# Patient Record
Sex: Female | Born: 1955 | Race: White | Hispanic: No | Smoking: Current every day smoker
Health system: Southern US, Community
[De-identification: ages and names within clinical notes are randomized; demographics above are authoritative.]

## PROBLEM LIST (undated history)

## (undated) DIAGNOSIS — J449 Chronic obstructive pulmonary disease, unspecified: Secondary | ICD-10-CM

## (undated) DIAGNOSIS — F5104 Psychophysiologic insomnia: Secondary | ICD-10-CM

## (undated) DIAGNOSIS — K648 Other hemorrhoids: Secondary | ICD-10-CM

## (undated) DIAGNOSIS — K5790 Diverticulosis of intestine, part unspecified, without perforation or abscess without bleeding: Secondary | ICD-10-CM

## (undated) DIAGNOSIS — E119 Type 2 diabetes mellitus without complications: Secondary | ICD-10-CM

## (undated) DIAGNOSIS — R7302 Impaired glucose tolerance (oral): Secondary | ICD-10-CM

## (undated) DIAGNOSIS — I10 Essential (primary) hypertension: Secondary | ICD-10-CM

## (undated) DIAGNOSIS — S42302A Unspecified fracture of shaft of humerus, left arm, initial encounter for closed fracture: Secondary | ICD-10-CM

## (undated) DIAGNOSIS — F419 Anxiety disorder, unspecified: Secondary | ICD-10-CM

## (undated) HISTORY — PX: OTHER SURGICAL HISTORY: SHX169

## (undated) HISTORY — PX: SEPTOPLASTY: SUR1290

## (undated) HISTORY — DX: Type 2 diabetes mellitus without complications: E11.9

## (undated) HISTORY — DX: Unspecified fracture of shaft of humerus, left arm, initial encounter for closed fracture: S42.302A

---

## 1998-08-03 HISTORY — PX: AUGMENTATION MAMMAPLASTY: SUR837

## 2005-10-23 ENCOUNTER — Other Ambulatory Visit: Payer: Self-pay

## 2005-10-26 ENCOUNTER — Ambulatory Visit: Payer: Self-pay | Admitting: Obstetrics and Gynecology

## 2008-11-06 ENCOUNTER — Ambulatory Visit: Payer: Self-pay | Admitting: Internal Medicine

## 2009-08-16 ENCOUNTER — Ambulatory Visit: Payer: Self-pay | Admitting: Gastroenterology

## 2010-06-02 ENCOUNTER — Ambulatory Visit: Payer: Self-pay | Admitting: Internal Medicine

## 2011-06-04 ENCOUNTER — Ambulatory Visit: Payer: Self-pay | Admitting: Internal Medicine

## 2012-06-07 ENCOUNTER — Ambulatory Visit: Payer: Self-pay | Admitting: Internal Medicine

## 2012-12-21 ENCOUNTER — Ambulatory Visit: Payer: Self-pay | Admitting: Emergency Medicine

## 2013-06-08 ENCOUNTER — Ambulatory Visit: Payer: Self-pay | Admitting: Internal Medicine

## 2014-02-05 DIAGNOSIS — E1159 Type 2 diabetes mellitus with other circulatory complications: Secondary | ICD-10-CM | POA: Insufficient documentation

## 2014-02-05 DIAGNOSIS — F5104 Psychophysiologic insomnia: Secondary | ICD-10-CM | POA: Insufficient documentation

## 2014-02-05 DIAGNOSIS — E1169 Type 2 diabetes mellitus with other specified complication: Secondary | ICD-10-CM | POA: Insufficient documentation

## 2014-02-05 DIAGNOSIS — E785 Hyperlipidemia, unspecified: Secondary | ICD-10-CM | POA: Insufficient documentation

## 2014-02-05 DIAGNOSIS — I152 Hypertension secondary to endocrine disorders: Secondary | ICD-10-CM | POA: Insufficient documentation

## 2014-02-05 DIAGNOSIS — M722 Plantar fascial fibromatosis: Secondary | ICD-10-CM | POA: Insufficient documentation

## 2014-06-05 DIAGNOSIS — E119 Type 2 diabetes mellitus without complications: Secondary | ICD-10-CM | POA: Insufficient documentation

## 2014-06-20 ENCOUNTER — Ambulatory Visit: Payer: Self-pay | Admitting: Internal Medicine

## 2014-09-02 ENCOUNTER — Ambulatory Visit: Payer: Self-pay | Admitting: Family Medicine

## 2015-01-28 ENCOUNTER — Encounter: Payer: Self-pay | Admitting: *Deleted

## 2015-01-29 ENCOUNTER — Ambulatory Visit
Admission: RE | Admit: 2015-01-29 | Discharge: 2015-01-29 | Disposition: A | Discharge status: Home Health | Payer: Managed Care, Other (non HMO) | Source: Ambulatory Visit | Attending: Gastroenterology | Admitting: Gastroenterology

## 2015-01-29 ENCOUNTER — Ambulatory Visit: Payer: Managed Care, Other (non HMO) | Admitting: Anesthesiology

## 2015-01-29 ENCOUNTER — Encounter: Payer: Self-pay | Admitting: *Deleted

## 2015-01-29 ENCOUNTER — Encounter
Admission: RE | Disposition: A | Discharge status: Home Health | Payer: Self-pay | Source: Ambulatory Visit | Attending: Gastroenterology

## 2015-01-29 DIAGNOSIS — I10 Essential (primary) hypertension: Secondary | ICD-10-CM | POA: Diagnosis not present

## 2015-01-29 DIAGNOSIS — Z79899 Other long term (current) drug therapy: Secondary | ICD-10-CM | POA: Insufficient documentation

## 2015-01-29 DIAGNOSIS — F419 Anxiety disorder, unspecified: Secondary | ICD-10-CM | POA: Diagnosis not present

## 2015-01-29 DIAGNOSIS — D127 Benign neoplasm of rectosigmoid junction: Secondary | ICD-10-CM | POA: Insufficient documentation

## 2015-01-29 DIAGNOSIS — K5730 Diverticulosis of large intestine without perforation or abscess without bleeding: Secondary | ICD-10-CM | POA: Diagnosis not present

## 2015-01-29 DIAGNOSIS — J449 Chronic obstructive pulmonary disease, unspecified: Secondary | ICD-10-CM | POA: Insufficient documentation

## 2015-01-29 DIAGNOSIS — F17210 Nicotine dependence, cigarettes, uncomplicated: Secondary | ICD-10-CM | POA: Diagnosis not present

## 2015-01-29 DIAGNOSIS — K621 Rectal polyp: Secondary | ICD-10-CM | POA: Insufficient documentation

## 2015-01-29 DIAGNOSIS — Z86010 Personal history of colonic polyps: Secondary | ICD-10-CM | POA: Insufficient documentation

## 2015-01-29 DIAGNOSIS — D126 Benign neoplasm of colon, unspecified: Secondary | ICD-10-CM | POA: Insufficient documentation

## 2015-01-29 HISTORY — PX: COLONOSCOPY WITH PROPOFOL: SHX5780

## 2015-01-29 HISTORY — DX: Anxiety disorder, unspecified: F41.9

## 2015-01-29 HISTORY — DX: Diverticulosis of intestine, part unspecified, without perforation or abscess without bleeding: K57.90

## 2015-01-29 HISTORY — DX: Impaired glucose tolerance (oral): R73.02

## 2015-01-29 HISTORY — DX: Psychophysiologic insomnia: F51.04

## 2015-01-29 HISTORY — DX: Essential (primary) hypertension: I10

## 2015-01-29 HISTORY — DX: Other hemorrhoids: K64.8

## 2015-01-29 HISTORY — DX: Chronic obstructive pulmonary disease, unspecified: J44.9

## 2015-01-29 SURGERY — COLONOSCOPY WITH PROPOFOL
Anesthesia: General

## 2015-01-29 MED ORDER — PROPOFOL 10 MG/ML IV BOLUS
INTRAVENOUS | Status: DC | PRN
Start: 2015-01-29 — End: 2015-01-29
  Administered 2015-01-29: 50 mg via INTRAVENOUS

## 2015-01-29 MED ORDER — PROPOFOL INFUSION 10 MG/ML OPTIME
INTRAVENOUS | Status: DC | PRN
  Administered 2015-01-29: 120 ug/kg/min via INTRAVENOUS

## 2015-01-29 MED ORDER — SODIUM CHLORIDE 0.9 % IV SOLN
INTRAVENOUS | Status: DC
  Administered 2015-01-29: 15:00:00 via INTRAVENOUS

## 2015-01-29 MED ORDER — IPRATROPIUM-ALBUTEROL 0.5-2.5 (3) MG/3ML IN SOLN
RESPIRATORY_TRACT | Status: AC
  Filled 2015-01-29: qty 3

## 2015-01-29 MED ORDER — SODIUM CHLORIDE 0.9 % IV SOLN
INTRAVENOUS | Status: DC | PRN
Start: 2015-01-29 — End: 2015-01-29
  Administered 2015-01-29 (×2): via INTRAVENOUS

## 2015-01-29 MED ORDER — IPRATROPIUM-ALBUTEROL 0.5-2.5 (3) MG/3ML IN SOLN
3.0000 mL | Freq: Four times a day (QID) | RESPIRATORY_TRACT | Status: DC
  Administered 2015-01-29: 3 mL via RESPIRATORY_TRACT

## 2015-01-29 MED ORDER — MIDAZOLAM HCL 2 MG/2ML IJ SOLN
INTRAMUSCULAR | Status: DC | PRN
  Administered 2015-01-29: 2 mg via INTRAVENOUS

## 2015-01-29 MED ORDER — PHENYLEPHRINE HCL 10 MG/ML IJ SOLN
INTRAMUSCULAR | Status: DC | PRN
  Administered 2015-01-29 (×3): 100 ug via INTRAVENOUS

## 2015-01-29 MED ORDER — LIDOCAINE HCL (CARDIAC) 20 MG/ML IV SOLN
INTRAVENOUS | Status: DC | PRN
  Administered 2015-01-29: 50 mg via INTRAVENOUS

## 2015-01-29 MED ORDER — EPHEDRINE SULFATE 50 MG/ML IJ SOLN
INTRAMUSCULAR | Status: DC | PRN
Start: 2015-01-29 — End: 2015-01-29
  Administered 2015-01-29: 5 mg via INTRAVENOUS

## 2015-01-29 MED ORDER — SODIUM CHLORIDE 0.9 % IV SOLN: INTRAVENOUS | Status: DC

## 2015-01-29 NOTE — Op Note (Signed)
[facility] Gastroenterology Patient Name: Barbara Cortez Procedure Date: 01/29/2015 4:08 PM MRN: [id number] Account #: [id number] Date of Birth: 06/13/1956 Admit Type: Outpatient Age: 59 Room: [facility] ENDO ROOM 3 Gender: Female Note Status: Finalized Procedure:         Colonoscopy Indications:       Personal history of colonic polyps, Change in bowel habits Providers:         Lollie Sails, MD Referring MD:      Christena Flake. Raechel Ache, MD (Referring MD) Medicines:         Monitored Anesthesia Care Complications:     No immediate complications. Procedure:         Pre-Anesthesia Assessment:                    - ASA Grade Assessment: II - A patient with mild systemic                     disease.                    After obtaining informed consent, the colonoscope was                     passed under direct vision. Throughout the procedure, the                     patient's blood pressure, pulse, and oxygen saturations                     were monitored continuously. The Colonoscope was                     introduced through the anus and advanced to the the cecum,                     identified by appendiceal orifice and ileocecal valve. The                     colonoscopy was performed with moderate difficulty due to                     poor bowel prep. Successful completion of the procedure                     was aided by lavage. The patient tolerated the procedure                     well. The quality of the bowel preparation was fair. Findings:      A few small-mouthed diverticula were found in the sigmoid colon and in       the distal descending colon.      A 4 mm polyp was found in the recto-sigmoid colon. The polyp was       sessile. The polyp was removed with a cold snare. Resection and       retrieval were complete.      Biopsies for histology were taken with a cold forceps from the right       colon and left colon for evaluation of microscopic colitis.  Nine sessile polyps were found in the recto-sigmoid colon. The polyps       were 1 to 3 mm in size. These polyps were removed with a cold biopsy       forceps. Resection and retrieval were complete.  Five sessile polyps were found in the rectum. The polyps were 2 to 4 mm       in size. These polyps were removed with a cold biopsy forceps and cold       snare. Resection and retrieval were complete. Impression:        - Diverticulosis in the sigmoid colon and in the distal                     descending colon.                    - One 4 mm polyp at the recto-sigmoid colon. Resected and                     retrieved.                    - Nine 1 to 3 mm polyps at the recto-sigmoid colon.                     Resected and retrieved.                    - Five 2 to 4 mm polyps in the rectum. Resected and                     retrieved.                    - Biopsies were taken with a cold forceps from the right                     colon and left colon for evaluation of microscopic colitis.                    - The distal rectum and anal verge are normal on                     retroflexion view. Recommendation:    - Await pathology results.                    - Telephone GI clinic for pathology results in 1 week.                    - Full liquid diet today.                    - Low residue diet for 3 days. Procedure Code(s): --- Professional ---                    5402618952, Colonoscopy, flexible; with removal of tumor(s),                     polyp(s), or other lesion(s) by snare technique                    45380, 36, Colonoscopy, flexible; with biopsy, single or                     multiple Diagnosis Code(s): --- Professional ---                    211.4, Benign neoplasm of rectum and anal canal                    569.0, Anal and rectal  polyp                    V12.72, Personal history of colonic polyps                    787.99, Other symptoms involving digestive system                     562.10, Diverticulosis of colon (without mention of                     hemorrhage) CPT copyright 2014 American Medical Association. All rights reserved. The codes documented in this report are preliminary and upon coder review may  be revised to meet current compliance requirements. Lollie Sails, MD 01/29/2015 5:13:25 PM This report has been signed electronically. Number of Addenda: 0 Note Initiated On: 01/29/2015 4:08 PM Scope Withdrawal Time: 0 hours 21 minutes 17 seconds  Total Procedure Duration: 0 hours 32 minutes 21 seconds       [facility]

## 2015-01-29 NOTE — H&P (Signed)
Outpatient short stay form Pre-procedure 01/29/2015 3:17 PM Lollie Sails MD  Primary Physician: Dr. Genene Churn  Reason for visit:  Colonoscopy  History of present illness:  Patient is a 59 year old female who is presenting today a personal history of polyps as well as change of bowel habits. She is been having loose/urgent stools hour after eating that occurs randomly. There has  been no exacerbating or alleviating factors identified. In discussion further with her this does not sound like irritable bowel though could be a variant. She takes no aspirin or blood thinning products.    Current facility-administered medications:  .  0.9 %  sodium chloride infusion, , Intravenous, Continuous, Lollie Sails, MD, Last Rate: 50 mL/hr at 01/29/15 1508 .  0.9 %  sodium chloride infusion, , Intravenous, Continuous, Lollie Sails, MD  Prescriptions prior to admission  Medication Sig Dispense Refill Last Dose  . amLODipine (NORVASC) 5 MG tablet Take 5 mg by mouth daily.   01/29/2015 at 0700  . ascorbic acid (VITAMIN C) 500 MG tablet Take 500 mg by mouth daily.     . Calcium Carb-Cholecalciferol (CALCIUM CARBONATE-VITAMIN D3) 600-400 MG-UNIT TABS Take by mouth.     . furosemide (LASIX) 20 MG tablet Take 20 mg by mouth.   01/29/2015 at 0700  . irbesartan (AVAPRO) 300 MG tablet Take 300 mg by mouth daily.   01/29/2015 at 0700  . metoprolol (TOPROL-XL) 200 MG 24 hr tablet Take 200 mg by mouth daily.   01/29/2015 at 0700  . simvastatin (ZOCOR) 20 MG tablet Take 20 mg by mouth daily.     . traZODone (DESYREL) 50 MG tablet Take 50 mg by mouth at bedtime.     Marland Kitchen zolpidem (AMBIEN) 10 MG tablet Take 10 mg by mouth at bedtime as needed for sleep.        Allergies  Allergen Reactions  . Ace Inhibitors   . Hydrochlorothiazide      Past Medical History  Diagnosis Date  . Hypertension   . COPD (chronic obstructive pulmonary disease)   . Anxiety   . Impaired glucose tolerance   . Chronic  insomnia   . Diverticulosis   . Internal hemorrhoids     Review of systems:      Physical Exam    Heart and lungs: Regular rate and rhythm without rub or gallop lungs bilaterally clear    HEENT: Norm cephalic atraumatic eyes are anicteric    Other:     Pertinant exam for procedure: Soft nontender nondistended bowel sounds positive normoactive.    Planned proceedures: Colonoscopy and indicated procedures I have discussed the risks benefits and complications of procedures to include not limited to bleeding, infection, perforation and the risk of sedation and the patient wishes to proceed.    Lollie Sails, MD Gastroenterology 01/29/2015  3:17 PM

## 2015-01-29 NOTE — Anesthesia Postprocedure Evaluation (Signed)
  Anesthesia Post-op Note  Patient: Barbara Cortez  Procedure(s) Performed: Procedure(s): COLONOSCOPY WITH PROPOFOL (N/A)  Anesthesia type:General  Patient location: PACU  Post pain: Pain level controlled  Post assessment: Post-op Vital signs reviewed, Patient's Cardiovascular Status Stable, Respiratory Function Stable, Patent Airway and No signs of Nausea or vomiting  Post vital signs: Reviewed and stable  Last Vitals:  Filed Vitals:   01/29/15 1800  BP:   Pulse: 71  Temp:   Resp: 13    Level of consciousness: awake, alert  and patient cooperative  Complications: No apparent anesthesia complications

## 2015-01-29 NOTE — Transfer of Care (Signed)
Immediate Anesthesia Transfer of Care Note  Patient: Barbara Cortez  Procedure(s) Performed: Procedure(s): COLONOSCOPY WITH PROPOFOL (N/A)  Patient Location: Endoscopy Unit  Anesthesia Type:General  Level of Consciousness: awake, alert , oriented and patient cooperative  Airway & Oxygen Therapy: Patient Spontanous Breathing and Patient connected to nasal cannula oxygen  Post-op Assessment: Report given to RN, Post -op Vital signs reviewed and stable and Patient moving all extremities X 4  Post vital signs: Reviewed and stable  Last Vitals:  Filed Vitals:   01/29/15 1716  BP: 99/62  Pulse: 79  Temp: 36 C  Resp: 12    Complications: No apparent anesthesia complications

## 2015-01-29 NOTE — Anesthesia Preprocedure Evaluation (Addendum)
Anesthesia Evaluation  Patient identified by MRN, date of birth, ID band Patient awake    Reviewed: Allergy & Precautions, NPO status , Patient's Chart, lab work & pertinent test results, reviewed documented beta blocker date and time   Airway Mallampati: II  TM Distance: >3 FB     Dental  (+) Chipped   Pulmonary Current Smoker,    + wheezing      Cardiovascular hypertension,     Neuro/Psych    GI/Hepatic   Endo/Other    Renal/GU      Musculoskeletal   Abdominal   Peds  Hematology   Anesthesia Other Findings She is coughing, some wheezing. Duoneb SVN.  Reproductive/Obstetrics                            Anesthesia Physical Anesthesia Plan  ASA: III  Anesthesia Plan: General   Post-op Pain Management:    Induction: Intravenous  Airway Management Planned: Nasal Cannula  Additional Equipment:   Intra-op Plan:   Post-operative Plan:   Informed Consent: I have reviewed the patients History and Physical, chart, labs and discussed the procedure including the risks, benefits and alternatives for the proposed anesthesia with the patient or authorized representative who has indicated his/her understanding and acceptance.     Plan Discussed with: CRNA  Anesthesia Plan Comments:         Anesthesia Quick Evaluation

## 2015-01-30 ENCOUNTER — Encounter: Payer: Self-pay | Admitting: Gastroenterology

## 2015-01-31 LAB — SURGICAL PATHOLOGY

## 2015-06-19 ENCOUNTER — Other Ambulatory Visit: Payer: Self-pay | Admitting: Internal Medicine

## 2015-06-19 DIAGNOSIS — Z79899 Other long term (current) drug therapy: Secondary | ICD-10-CM | POA: Insufficient documentation

## 2015-06-19 DIAGNOSIS — Z1231 Encounter for screening mammogram for malignant neoplasm of breast: Secondary | ICD-10-CM

## 2015-12-25 ENCOUNTER — Ambulatory Visit
Admission: RE | Admit: 2015-12-25 | Discharge: 2015-12-25 | Disposition: A | Discharge status: Home / Self Care | Payer: Managed Care, Other (non HMO) | Source: Ambulatory Visit | Attending: Internal Medicine | Admitting: Internal Medicine

## 2015-12-25 ENCOUNTER — Other Ambulatory Visit: Payer: Self-pay | Admitting: Internal Medicine

## 2015-12-25 DIAGNOSIS — Z1231 Encounter for screening mammogram for malignant neoplasm of breast: Secondary | ICD-10-CM

## 2017-05-25 ENCOUNTER — Other Ambulatory Visit: Payer: Self-pay | Admitting: Internal Medicine

## 2017-05-25 DIAGNOSIS — Z1231 Encounter for screening mammogram for malignant neoplasm of breast: Secondary | ICD-10-CM

## 2017-06-22 ENCOUNTER — Ambulatory Visit
Admission: RE | Admit: 2017-06-22 | Discharge: 2017-06-22 | Disposition: A | Discharge status: Home / Self Care | Payer: Managed Care, Other (non HMO) | Source: Ambulatory Visit | Attending: Internal Medicine | Admitting: Internal Medicine

## 2017-06-22 DIAGNOSIS — Z1231 Encounter for screening mammogram for malignant neoplasm of breast: Secondary | ICD-10-CM | POA: Insufficient documentation

## 2018-03-07 LAB — HM COLONOSCOPY

## 2018-07-14 DIAGNOSIS — M4727 Other spondylosis with radiculopathy, lumbosacral region: Secondary | ICD-10-CM | POA: Insufficient documentation

## 2018-07-14 DIAGNOSIS — M545 Low back pain, unspecified: Secondary | ICD-10-CM | POA: Insufficient documentation

## 2018-07-14 DIAGNOSIS — Z6835 Body mass index (BMI) 35.0-35.9, adult: Secondary | ICD-10-CM | POA: Insufficient documentation

## 2018-10-19 DIAGNOSIS — I890 Lymphedema, not elsewhere classified: Secondary | ICD-10-CM | POA: Insufficient documentation

## 2018-12-26 ENCOUNTER — Ambulatory Visit
Admission: EM | Admit: 2018-12-26 | Discharge: 2018-12-26 | Disposition: A | Discharge status: Home / Self Care | Payer: Managed Care, Other (non HMO) | Attending: Urgent Care | Admitting: Urgent Care

## 2018-12-26 ENCOUNTER — Other Ambulatory Visit: Payer: Self-pay

## 2018-12-26 ENCOUNTER — Encounter: Payer: Self-pay | Admitting: Emergency Medicine

## 2018-12-26 DIAGNOSIS — S61213A Laceration without foreign body of left middle finger without damage to nail, initial encounter: Secondary | ICD-10-CM

## 2018-12-26 DIAGNOSIS — W269XXA Contact with unspecified sharp object(s), initial encounter: Secondary | ICD-10-CM

## 2018-12-26 MED ORDER — BACITRACIN ZINC 500 UNIT/GM EX OINT: TOPICAL_OINTMENT | Freq: Once | CUTANEOUS | Status: DC

## 2018-12-26 MED ORDER — SILVER NITRATE-POT NITRATE 75-25 % EX MISC
1.0000 | Freq: Once | CUTANEOUS | Status: AC
  Administered 2018-12-26: 17:00:00 1 via TOPICAL

## 2018-12-26 NOTE — Discharge Instructions (Addendum)
It was very nice meeting you today in clinic. Thank you for entrusting me with your care.   As discussed, keep finger clean and dry as possible. Apply antibiotic ointment at least twice a day. Keep covered. Monitor for signs of infection (increased redness, drainage, warmth, fever, swelling). May use Tylenol and/or Ibuprofen as needed for pain/fever.   Make arrangements to follow up with your regular doctor as needed. If your symptoms/condition worsens, please seek follow up care either here or in the ER. Please remember, our [facility] providers are "right here with you" when you need [place].   Again, it was my pleasure to take care of you today. Thank you for choosing our clinic. I hope that you start to feel better quickly.   Honor Loh, MSN, APRN, FNP-C, CEN Advanced Practice Provider [facility]

## 2018-12-26 NOTE — ED Triage Notes (Signed)
Pt cut her right middle finger on a mandolin about 45 minutes ago.  Last tetanus was 07/06/2017

## 2018-12-26 NOTE — ED Provider Notes (Signed)
[address], [place], [place] [address] [phone]   Name: Barbara Cortez DOB: [birth year] MRN: [id number] CSN: [id number] PCP: Ezequiel Kayser, MD  Arrival date and time:  12/26/18 1555  Chief Complaint:  Laceration  NOTE: Prior to seeing the patient today, I have reviewed the triage nursing documentation and vital signs. Clinical staff has updated patient's PMH/PSHx, current medication list, and drug allergies/intolerances to ensure comprehensive history available to assist in medical decision making.   History:   HPI: Barbara Cortez is a 63 y.o. female who presents today with complaints of finger laceration.  Approximately 1 hour prior to arrival, patient notes that she was cutting potatoes using a mandolin.  Blade slipped and cut the tip of the 3rd digit on her LEFT hand.  Patient presents to the clinic bleeding despite direct pressure.  Patient is not on daily aspirin or DOAC therapy. There is no nail involvement.  She notes that her tetanus vaccination is up-to-date per recommended guidelines.  Past Medical History:  Diagnosis Date   Anxiety    Chronic insomnia    COPD (chronic obstructive pulmonary disease) ([place])    Diverticulosis    Hypertension    Impaired glucose tolerance    Internal hemorrhoids     Past Surgical History:  Procedure Laterality Date   AUGMENTATION MAMMAPLASTY Bilateral 2000   COLONOSCOPY WITH PROPOFOL N/A 01/29/2015   Procedure: COLONOSCOPY WITH PROPOFOL;  Surgeon: Lollie Sails, MD;  Location: [facility] ENDOSCOPY;  Service: Endoscopy;  Laterality: N/A;   Hysteroscopy wtih enodmetrial ablation     Saline Breast Implants     SEPTOPLASTY      Family History  Problem Relation Age of Onset   Breast cancer Maternal Aunt    Breast cancer Other 32    Social History   Socioeconomic History   Marital status: Divorced    Spouse name: Not on file   Number of children: Not on file   Years of education: Not on file     Highest education level: Not on file  Occupational History   Not on file  Social Needs   Financial resource strain: Not on file   Food insecurity:    Worry: Not on file    Inability: Not on file   Transportation needs:    Medical: Not on file    Non-medical: Not on file  Tobacco Use   Smoking status: Current Every Day Smoker    Packs/day: 0.50    Types: Cigarettes   Smokeless tobacco: Never Used  Substance and Sexual Activity   Alcohol use: Yes    Comment: occasionally   Drug use: No   Sexual activity: Not on file  Lifestyle   Physical activity:    Days per week: Not on file    Minutes per session: Not on file   Stress: Not on file  Relationships   Social connections:    Talks on phone: Not on file    Gets together: Not on file    Attends religious service: Not on file    Active member of club or organization: Not on file    Attends meetings of clubs or organizations: Not on file    Relationship status: Not on file   Intimate partner violence:    Fear of current or ex partner: Not on file    Emotionally abused: Not on file    Physically abused: Not on file    Forced sexual activity: Not on file  Other Topics Concern  Not on file  Social History Narrative   Not on file    There are no active problems to display for this patient.   Home Medications:    Current Meds  Medication Sig   amLODipine (NORVASC) 5 MG tablet Take 5 mg by mouth daily.   ascorbic acid (VITAMIN C) 500 MG tablet Take 500 mg by mouth daily.   Calcium Carb-Cholecalciferol (CALCIUM CARBONATE-VITAMIN D3) 600-400 MG-UNIT TABS Take by mouth.   furosemide (LASIX) 20 MG tablet Take 20 mg by mouth.   irbesartan (AVAPRO) 300 MG tablet Take 300 mg by mouth daily.   metoprolol (TOPROL-XL) 200 MG 24 hr tablet Take 200 mg by mouth daily.   simvastatin (ZOCOR) 20 MG tablet Take 20 mg by mouth daily.   traZODone (DESYREL) 50 MG tablet Take 50 mg by mouth at bedtime.   zolpidem  (AMBIEN) 10 MG tablet Take 10 mg by mouth at bedtime as needed for sleep.    Allergies:   Ace inhibitors and Hydrochlorothiazide  Review of Systems (ROS): Review of Systems  Constitutional: Negative for chills and fever.  Respiratory: Negative for cough and shortness of breath.   Cardiovascular: Negative for chest pain and palpitations.  Skin: Positive for wound (tip of 3rd digit on LEFT hand).  Neurological: Negative for dizziness and light-headedness.  Psychiatric/Behavioral: The patient is nervous/anxious.      Physical Exam:  Triage Vital Signs ED Triage Vitals  Enc Vitals Group     BP 12/26/18 1614 129/78     Pulse Rate 12/26/18 1614 89     Resp 12/26/18 1614 18     Temp 12/26/18 1614 98.1 F (36.7 C)     Temp Source 12/26/18 1614 Oral     SpO2 12/26/18 1614 99 %     Weight 12/26/18 1611 210 lb (95.3 kg)     Height 12/26/18 1611 5\' 6"  (1.676 m)     Head Circumference --      Peak Flow --      Pain Score 12/26/18 1611 2     Pain Loc --      Pain Edu? --      Excl. in [place]? --     Physical Exam  Constitutional: She is oriented to person, place, and time and well-developed, well-nourished, and in no distress.  HENT:  Head: Normocephalic and atraumatic.  Mouth/Throat: Mucous membranes are normal.  Eyes: EOM are normal.  Cardiovascular: Normal rate, regular rhythm, normal heart sounds and intact distal pulses. Exam reveals no gallop and no friction rub.  No murmur heard. Pulmonary/Chest: Effort normal and breath sounds normal. No respiratory distress. She has no wheezes. She has no rales.  Neurological: She is alert and oriented to person, place, and time.  Skin: Skin is warm and dry. Laceration (0.5 cm skin avulsion to tip of 3rd digit on LEFT hand) noted. No erythema.  Psychiatric: Mood, affect and judgment normal.  Nursing note and vitals reviewed.    Urgent Care Treatments / Results:   LABS: PLEASE NOTE: all labs that were ordered this encounter are listed,  however only abnormal results are displayed. Labs Reviewed - No data to display  EKG: -None  RADIOLOGY: No results found.  PRODEDURES: Laceration Repair Performed by: Karen Kitchens, NP Authorized by: Karen Kitchens, NP   Consent:    Consent obtained:  Verbal   Consent given by:  Patient   Risks discussed:  Infection, need for additional repair, pain, poor cosmetic result and poor  wound healing   Alternatives discussed:  No treatment Universal protocol:    Patient identity confirmed:  Verbally with patient Laceration details:    Location:  Finger   Finger location:  L long finger   Length (cm):  0.5 Repair type:    Repair type:  Simple Exploration:    Hemostasis obtained with: Attempted Surgicel; not effective. Silver nitrate applied; hemostatis achieved.   Wound exploration: wound explored through full range of motion and entire depth of wound probed and visualized     Wound extent: fascia violated   Treatment:    Area cleansed with:  Betadine and saline   Amount of cleaning:  Standard   Irrigation solution:  Sterile saline Skin repair:    Repair method: Avulsion; tip no amenable to closure. Post-procedure details:    Dressing:  Antibiotic ointment, non-adherent dressing and bulky dressing   Patient tolerance of procedure:  Tolerated well, no immediate complications    MEDICATIONS RECEIVED THIS VISIT: Medications  bacitracin ointment (has no administration in time range)  silver nitrate applicators applicator 1 Stick (1 Stick Topical Given 12/26/18 1645)    PERTINENT CLINICAL COURSE NOTES/UPDATES: No data to display   Initial Impression / Assessment and Plan / Urgent Care Course:    Barbara Cortez is a 63 y.o. female who presents to [facility] today with complaints of Laceration  Pertinent labs & imaging results that were available during my care of the patient were personally reviewed by me and considered in my medical decision making (see  lab/imaging section of note for values and interpretations).  Exam revealed 0.5 cm avulsion to the tip of her LEFT middle finger.  Wound not amenable to skin closure.  Again, tetanus status up-to-date.  Attempted Surgicel application, however it was ineffective.  Silver nitrate applied and hemostasis achieved as above.  Patient tolerated procedure well.  Educated on signs and symptoms of infection.  Wound cleansed and TAO applied.  Patient encouraged to keep antibiotic ointment on wound and to keep area covered.  Discussed follow up with primary care physician in 1 week for re-evaluation if needed. I have reviewed the follow up and strict return precautions for any new or worsening symptoms. Patient is aware of symptoms that would be deemed urgent/emergent, and would thus require further evaluation either here or in the emergency department. At the time of discharge, she verbalized understanding and consent with the discharge plan as it was reviewed with her. All questions were fielded by provider and/or clinic staff prior to patient discharge.    Final Clinical Impressions(s) / Urgent Care Diagnoses:   Final diagnoses:  Laceration of left middle finger without foreign body without damage to nail, initial encounter    New Prescriptions:   Meds ordered this encounter  Medications   silver nitrate applicators applicator 1 Stick   bacitracin ointment    Controlled Substance Prescriptions:  [place] Controlled Substance Registry consulted? Not Applicable  NOTE: This note was prepared using Dragon dictation software along with smaller phrase technology. Despite my best ability to proofread, there is the potential that transcriptional errors may still occur from this process, and are completely unintentional.     Karen Kitchens, NP 12/26/18 1800

## 2019-07-20 ENCOUNTER — Other Ambulatory Visit: Payer: Self-pay | Admitting: Internal Medicine

## 2019-07-20 DIAGNOSIS — J310 Chronic rhinitis: Secondary | ICD-10-CM | POA: Insufficient documentation

## 2019-07-20 DIAGNOSIS — Z1231 Encounter for screening mammogram for malignant neoplasm of breast: Secondary | ICD-10-CM

## 2019-09-25 ENCOUNTER — Ambulatory Visit
Admission: RE | Admit: 2019-09-25 | Discharge: 2019-09-25 | Disposition: A | Discharge status: Home / Self Care | Payer: Managed Care, Other (non HMO) | Source: Ambulatory Visit | Attending: Internal Medicine | Admitting: Internal Medicine

## 2019-09-25 ENCOUNTER — Other Ambulatory Visit: Payer: Self-pay

## 2019-09-25 DIAGNOSIS — Z1231 Encounter for screening mammogram for malignant neoplasm of breast: Secondary | ICD-10-CM | POA: Insufficient documentation

## 2020-10-01 LAB — HM PAP SMEAR

## 2020-10-16 ENCOUNTER — Other Ambulatory Visit: Payer: Self-pay | Admitting: Internal Medicine

## 2020-10-16 DIAGNOSIS — Z1231 Encounter for screening mammogram for malignant neoplasm of breast: Secondary | ICD-10-CM

## 2020-10-30 ENCOUNTER — Ambulatory Visit
Admission: RE | Admit: 2020-10-30 | Discharge: 2020-10-30 | Disposition: A | Discharge status: Home / Self Care | Payer: Managed Care, Other (non HMO) | Source: Ambulatory Visit | Attending: Internal Medicine | Admitting: Internal Medicine

## 2020-10-30 ENCOUNTER — Other Ambulatory Visit: Payer: Self-pay

## 2020-10-30 DIAGNOSIS — Z1231 Encounter for screening mammogram for malignant neoplasm of breast: Secondary | ICD-10-CM | POA: Insufficient documentation

## 2021-04-03 LAB — HM DIABETES EYE EXAM

## 2021-06-24 ENCOUNTER — Ambulatory Visit: Payer: Managed Care, Other (non HMO) | Admitting: Family Medicine

## 2021-06-24 ENCOUNTER — Ambulatory Visit (INDEPENDENT_AMBULATORY_CARE_PROVIDER_SITE_OTHER): Payer: Managed Care, Other (non HMO) | Admitting: Family Medicine

## 2021-06-24 ENCOUNTER — Other Ambulatory Visit: Payer: Self-pay

## 2021-06-24 ENCOUNTER — Encounter: Payer: Self-pay | Admitting: Family Medicine

## 2021-06-24 VITALS — BP 146/96 | HR 80 | Ht 66.0 in | Wt 218.0 lb

## 2021-06-24 DIAGNOSIS — J029 Acute pharyngitis, unspecified: Secondary | ICD-10-CM

## 2021-06-24 DIAGNOSIS — R5383 Other fatigue: Secondary | ICD-10-CM | POA: Diagnosis not present

## 2021-06-24 DIAGNOSIS — F5104 Psychophysiologic insomnia: Secondary | ICD-10-CM

## 2021-06-24 DIAGNOSIS — D126 Benign neoplasm of colon, unspecified: Secondary | ICD-10-CM

## 2021-06-24 DIAGNOSIS — E1159 Type 2 diabetes mellitus with other circulatory complications: Secondary | ICD-10-CM

## 2021-06-24 DIAGNOSIS — E1169 Type 2 diabetes mellitus with other specified complication: Secondary | ICD-10-CM

## 2021-06-24 DIAGNOSIS — I152 Hypertension secondary to endocrine disorders: Secondary | ICD-10-CM

## 2021-06-24 DIAGNOSIS — Z7689 Persons encountering health services in other specified circumstances: Secondary | ICD-10-CM | POA: Diagnosis not present

## 2021-06-24 DIAGNOSIS — E119 Type 2 diabetes mellitus without complications: Secondary | ICD-10-CM | POA: Diagnosis not present

## 2021-06-24 DIAGNOSIS — E785 Hyperlipidemia, unspecified: Secondary | ICD-10-CM

## 2021-06-24 MED ORDER — ZOLPIDEM TARTRATE 10 MG PO TABS: 10.0000 mg | ORAL_TABLET | Freq: Every evening | ORAL | 0 refills | Status: DC | PRN

## 2021-06-24 MED ORDER — METFORMIN HCL ER 500 MG PO TB24: 500.0000 mg | ORAL_TABLET | Freq: Every day | ORAL | 1 refills | Status: DC

## 2021-06-24 MED ORDER — METOPROLOL SUCCINATE ER 200 MG PO TB24: 200.0000 mg | ORAL_TABLET | Freq: Every day | ORAL | 1 refills | Status: DC

## 2021-06-24 MED ORDER — FUROSEMIDE 40 MG PO TABS: 40.0000 mg | ORAL_TABLET | Freq: Every day | ORAL | 1 refills | Status: DC

## 2021-06-24 MED ORDER — AMLODIPINE BESYLATE 5 MG PO TABS: 5.0000 mg | ORAL_TABLET | Freq: Every day | ORAL | 1 refills | Status: DC

## 2021-06-24 MED ORDER — IRBESARTAN 300 MG PO TABS: 300.0000 mg | ORAL_TABLET | Freq: Every day | ORAL | 1 refills | Status: DC

## 2021-06-24 MED ORDER — SIMVASTATIN 20 MG PO TABS: 20.0000 mg | ORAL_TABLET | Freq: Every day | ORAL | 1 refills | Status: DC

## 2021-06-24 NOTE — Progress Notes (Signed)
Date:  06/24/2021   Name:  Barbara Cortez   DOB:  04-22-1956   MRN:  [id number]   Chief Complaint: New Patient (Initial Visit) and Establish Care  Patient is a 65 year old female who presents for a comprehensive physical exam. The patient reports the following problems: need med refills. Health maintenance has been reviewed up to date.    Sore Throat  This is a new problem. The current episode started in the past 7 days. The problem has been gradually improving. Neither side of throat is experiencing more pain than the other. There has been no fever. The pain is mild. Pertinent negatives include no abdominal pain, coughing, diarrhea, drooling, ear discharge, ear pain, headaches, hoarse voice, neck pain, shortness of breath or swollen glands. She has had no exposure to strep or mono.   No results found for: CREATININE, BUN, NA, K, CL, CO2 No results found for: CHOL, HDL, LDLCALC, LDLDIRECT, TRIG, CHOLHDL No results found for: TSH No results found for: HGBA1C No results found for: WBC, HGB, HCT, MCV, PLT No results found for: ALT, AST, GGT, ALKPHOS, BILITOT No components found for: VITD  Review of Systems  Constitutional:  Negative for chills and fever.  HENT:  Negative for drooling, ear discharge, ear pain, hoarse voice and sore throat.   Eyes:  Negative for visual disturbance.  Respiratory:  Negative for cough, shortness of breath and wheezing.   Cardiovascular:  Negative for chest pain, palpitations and leg swelling.  Gastrointestinal:  Negative for abdominal pain, blood in stool, constipation, diarrhea and nausea.  Endocrine: Negative for polydipsia.  Genitourinary:  Negative for dysuria, frequency, hematuria and urgency.       Urge incontinenece  Musculoskeletal:  Negative for back pain, myalgias and neck pain.  Skin:  Negative for rash.  Allergic/Immunologic: Negative for environmental allergies.  Neurological:  Negative for dizziness and headaches.  Hematological:   Does not bruise/bleed easily.  Psychiatric/Behavioral:  Negative for suicidal ideas. The patient is not nervous/anxious.    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   Lymphedema of both lower extremities 10/19/2018   Body mass index (BMI) of 35.0 to 35.9 with comorbidity 07/14/2018   Chronic bilateral low back pain without sciatica 07/14/2018   High risk medication use 06/19/2015   Tubular adenoma of colon 01/29/2015   Diabetes mellitus type II, controlled ([place]) 06/05/2014   Chronic insomnia 02/05/2014   Hyperlipidemia associated with type 2 diabetes mellitus ([place]) 02/05/2014   Hypertension associated with type 2 diabetes mellitus ([place]) 02/05/2014   Plantar fasciitis 02/05/2014    Allergies  Allergen Reactions   Ace Inhibitors Cough   Hydrochlorothiazide Itching    Photosensitivity    Past Surgical History:  Procedure Laterality Date   AUGMENTATION MAMMAPLASTY Bilateral 2000   COLONOSCOPY WITH PROPOFOL N/A 01/29/2015   Procedure: COLONOSCOPY WITH PROPOFOL;  Surgeon: Lollie Sails, MD;  Location: [facility] ENDOSCOPY;  Service: Endoscopy;  Laterality: N/A;   Hysteroscopy wtih enodmetrial ablation     Saline Breast Implants     SEPTOPLASTY      Social History   Tobacco Use   Smoking status: Every Day    Packs/day: 0.25    Years: 30.00    Pack years: 7.50    Types: Cigarettes   Smokeless tobacco: Never  Vaping Use   Vaping Use: Never used  Substance Use Topics   Alcohol use: Not Currently    Alcohol/week: 3.0 standard  drinks    Types: 3 Glasses of wine per week   Drug use: Never     Medication list has been reviewed and updated.  Current Meds  Medication Sig   amLODipine (NORVASC) 5 MG tablet Take 5 mg by mouth daily.   ascorbic acid (VITAMIN C) 500 MG tablet Take 500 mg by mouth daily.   Calcium Carb-Cholecalciferol (CALCIUM CARBONATE-VITAMIN D3) 600-400 MG-UNIT TABS Take 1 tablet by mouth daily.   furosemide (LASIX) 40 MG  tablet Take 40 mg by mouth daily.   irbesartan (AVAPRO) 300 MG tablet Take 300 mg by mouth daily.   metFORMIN (GLUCOPHAGE-XR) 500 MG 24 hr tablet Take 500 mg by mouth daily.   metoprolol (TOPROL-XL) 200 MG 24 hr tablet Take 200 mg by mouth daily.   Omega-3 Fatty Acids (FISH OIL) 1000 MG CAPS Take 2 capsules by mouth daily.   Semaglutide,0.25 or 0.5MG /DOS, (OZEMPIC, 0.25 OR 0.5 MG/DOSE,) 2 MG/1.5ML SOPN Inject 0.5 mg into the skin once a week.   simvastatin (ZOCOR) 20 MG tablet Take 20 mg by mouth daily.   zolpidem (AMBIEN) 10 MG tablet Take 10 mg by mouth at bedtime as needed for sleep.    No flowsheet data found.  No flowsheet data found.  BP Readings from Last 3 Encounters:  06/24/21 (!) 152/96  12/26/18 129/78  01/29/15 (!) 142/82    Physical Exam Vitals and nursing note reviewed.  Constitutional:      Appearance: She is well-developed.  HENT:     Head: Normocephalic.     Right Ear: External ear normal.     Left Ear: External ear normal.  Eyes:     General: Lids are everted, no foreign bodies appreciated. No scleral icterus.       Left eye: No foreign body or hordeolum.     Conjunctiva/sclera: Conjunctivae normal.     Right eye: Right conjunctiva is not injected.     Left eye: Left conjunctiva is not injected.     Pupils: Pupils are equal, round, and reactive to light.  Neck:     Thyroid: No thyromegaly.     Vascular: No JVD.     Trachea: No tracheal deviation.  Cardiovascular:     Rate and Rhythm: Normal rate and regular rhythm.     Heart sounds: Normal heart sounds. No murmur heard.   No friction rub. No gallop.  Pulmonary:     Effort: Pulmonary effort is normal. No respiratory distress.     Breath sounds: Normal breath sounds. No wheezing or rales.  Abdominal:     General: Bowel sounds are normal.     Palpations: Abdomen is soft. There is no mass.     Tenderness: There is no abdominal tenderness. There is no guarding or rebound.  Musculoskeletal:         General: No tenderness. Normal range of motion.     Cervical back: Normal range of motion and neck supple.  Lymphadenopathy:     Cervical: No cervical adenopathy.  Skin:    General: Skin is warm.     Findings: No rash.  Neurological:     Mental Status: She is alert and oriented to person, place, and time.     Cranial Nerves: No cranial nerve deficit.     Deep Tendon Reflexes: Reflexes normal.  Psychiatric:        Mood and Affect: Mood is not anxious or depressed.    Wt Readings from Last 3 Encounters:  06/24/21 218 lb (98.9  kg)  12/26/18 210 lb (95.3 kg)  01/29/15 190 lb (86.2 kg)    BP (!) 152/96 (BP Location: Left Arm, Patient Position: Sitting, Cuff Size: Normal)   Pulse 80   Ht 5\' 6"  (1.676 m)   Wt 218 lb (98.9 kg)   SpO2 97%   BMI 35.19 kg/m   Assessment and Plan:`  1. Establishing care with new doctor, encounter for Patient establishing care with new physician.  2. Fatigue, unspecified type New onset with weight gain.  Patient is having increasing fatigue over the past couple months and we will check TSH for current possibility of hypothyroid. - TSH  3. Controlled type 2 diabetes mellitus without complication, without long-term current use of insulin (HCC) Chronic.  Controlled.  Stable.  Currently up to date with a recent refill of Ozempic as well as metformin. - Hemoglobin A1c  4. Chronic insomnia Chronic.  Episodic.  Patient has occasional nights of insomnia which she needs access to Ambien over 68-month.  We will give her 16 tablets. - zolpidem (AMBIEN) 10 MG tablet; Take 1 tablet (10 mg total) by mouth at bedtime as needed for sleep.  Dispense: 16 tablet; Refill: 0  5. Tubular adenoma of colon In 2016 patient had a tubular adenoma of 1 of several polyps that were normal.  Since then she had an episode of rectal bleeding in 2019 which she had a repeat colonoscopy that was unremarkable and apparently her next colonoscopy will be in 2024.  6. Hyperlipidemia  associated with type 2 diabetes mellitus (HCC) Chronic.  Controlled.  Stable.  Currently is on atorvastatin and we will check lipid panel at this time - Lipid Panel With LDL/HDL Ratio  7. Hypertension associated with type 2 diabetes mellitus (HCC) Chronic.  Uncontrolled.  With recent weight gain patient in the 146/96 range.  Patient is going to try to lose weight over the next 3 months and will recheck in February at which time we will adjust medication if necessary.  We will check renal function panel at this time for electrolytes and GFR. - Renal Function Panel  8. Pharyngitis, unspecified etiology New onset.  Persistent.  Gradually weaning.  Throat is red with upcoming holiday centered around children rapid strep was done and this was negative for strep and most likely that this is up a viral nature.

## 2021-06-25 LAB — LIPID PANEL WITH LDL/HDL RATIO
Cholesterol, Total: 208 mg/dL — ABNORMAL HIGH (ref 100–199)
HDL: 60 mg/dL (ref >39)
LDL Chol Calc (NIH): 125 mg/dL — ABNORMAL HIGH (ref 0–99)
LDL/HDL Ratio: 2.1 ratio (ref 0.0–3.2)
Triglycerides: 132 mg/dL (ref 0–149)
VLDL Cholesterol Cal: 23 mg/dL (ref 5–40)

## 2021-06-25 LAB — RENAL FUNCTION PANEL
Albumin: 4.8 g/dL (ref 3.8–4.8)
BUN/Creatinine Ratio: 14 (ref 12–28)
BUN: 11 mg/dL (ref 8–27)
CO2: 28 mmol/L (ref 20–29)
Calcium: 10.2 mg/dL (ref 8.7–10.3)
Chloride: 101 mmol/L (ref 96–106)
Creatinine, Ser: 0.76 mg/dL (ref 0.57–1.00)
Glucose: 97 mg/dL (ref 70–99)
Phosphorus: 3.8 mg/dL (ref 3.0–4.3)
Potassium: 4.3 mmol/L (ref 3.5–5.2)
Sodium: 143 mmol/L (ref 134–144)
eGFR: 87 mL/min/{1.73_m2} (ref >59)

## 2021-06-25 LAB — HEMOGLOBIN A1C
Est. average glucose Bld gHb Est-mCnc: 123 mg/dL
Hgb A1c MFr Bld: 5.9 % — ABNORMAL HIGH (ref 4.8–5.6)

## 2021-06-25 LAB — TSH: TSH: 1.65 u[IU]/mL (ref 0.450–4.500)

## 2021-07-07 NOTE — Progress Notes (Signed)
Abstract entry

## 2021-08-28 ENCOUNTER — Ambulatory Visit
Admission: EM | Admit: 2021-08-28 | Discharge: 2021-08-28 | Disposition: A | Discharge status: Home / Self Care | Payer: Managed Care, Other (non HMO) | Attending: Internal Medicine | Admitting: Internal Medicine

## 2021-08-28 ENCOUNTER — Other Ambulatory Visit: Payer: Self-pay

## 2021-08-28 ENCOUNTER — Encounter: Payer: Self-pay | Admitting: Emergency Medicine

## 2021-08-28 DIAGNOSIS — Z20822 Contact with and (suspected) exposure to covid-19: Secondary | ICD-10-CM | POA: Diagnosis not present

## 2021-08-28 DIAGNOSIS — H109 Unspecified conjunctivitis: Secondary | ICD-10-CM | POA: Diagnosis not present

## 2021-08-28 DIAGNOSIS — J069 Acute upper respiratory infection, unspecified: Secondary | ICD-10-CM | POA: Insufficient documentation

## 2021-08-28 DIAGNOSIS — H1089 Other conjunctivitis: Secondary | ICD-10-CM | POA: Diagnosis present

## 2021-08-28 LAB — RESP PANEL BY RT-PCR (FLU A&B, COVID) ARPGX2
Influenza A by PCR: NEGATIVE
Influenza B by PCR: NEGATIVE
SARS Coronavirus 2 by RT PCR: NEGATIVE

## 2021-08-28 MED ORDER — SULFACETAMIDE SODIUM 10 % OP SOLN
1.0000 [drp] | Freq: Three times a day (TID) | OPHTHALMIC | 0 refills | Status: DC
Start: 2021-08-28 — End: 2021-09-24

## 2021-08-28 MED ORDER — FLUTICASONE PROPIONATE 50 MCG/ACT NA SUSP: 2.0000 | Freq: Every day | NASAL | 0 refills | Status: DC

## 2021-08-28 NOTE — ED Triage Notes (Signed)
Pt c/o headache, nasal congestion, sore throat, cough, bilateral ear pain, right eye redness, and matting. Started about 2 days ago. Denies fever. Home covid test negative this morning.

## 2021-08-28 NOTE — ED Provider Notes (Signed)
[facility]    CSN: [id number] Arrival date & time: 08/28/21  0809      History   Chief Complaint Chief Complaint  Patient presents with   Nasal Congestion    HPI Barbara Cortez is a 66 y.o. female who presents with onset of HA, nose congestion, ST, cough, bilateral ear pain, and R eye redness and matting x 2 days. Denies fever, aches or sweats Had negative home covid test this am. She woke up with R eye matted and since then has been wiping cream color matter. Yesterday had to leave work due to feeling achy, chills and stuffy. Her 27 month old granddaughter more likely gave her this illness Has had thick yellow nose mucous. Denies being sick at all in the past month.    Past Medical History:  Diagnosis Date   Anxiety    Broken arm, left, closed, initial encounter    Chronic insomnia    COPD (chronic obstructive pulmonary disease) ([place])    Diabetes mellitus without complication ([place])    Diverticulosis    Hypertension    Impaired glucose tolerance    Internal hemorrhoids     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   Lymphedema of both lower extremities 10/19/2018   Body mass index (BMI) of 35.0 to 35.9 with comorbidity 07/14/2018   Chronic bilateral low back pain without sciatica 07/14/2018   High risk medication use 06/19/2015   Tubular adenoma of colon 01/29/2015   Diabetes mellitus type II, controlled ([place]) 06/05/2014   Chronic insomnia 02/05/2014   Hyperlipidemia associated with type 2 diabetes mellitus ([place]) 02/05/2014   Hypertension associated with type 2 diabetes mellitus ([place]) 02/05/2014   Plantar fasciitis 02/05/2014    Past Surgical History:  Procedure Laterality Date   AUGMENTATION MAMMAPLASTY Bilateral 2000   COLONOSCOPY WITH PROPOFOL N/A 01/29/2015   Procedure: COLONOSCOPY WITH PROPOFOL;  Surgeon: Lollie Sails, MD;  Location: [facility] ENDOSCOPY;  Service: Endoscopy;  Laterality: N/A;    Hysteroscopy wtih enodmetrial ablation     Saline Breast Implants     SEPTOPLASTY      OB History   No obstetric history on file.      Home Medications    Prior to Admission medications   Medication Sig Start Date End Date Taking? Authorizing Provider  amLODipine (NORVASC) 5 MG tablet Take 1 tablet (5 mg total) by mouth daily. 06/24/21  Yes Juline Patch, MD  ascorbic acid (VITAMIN C) 500 MG tablet Take 500 mg by mouth daily.   Yes [provider]  Calcium Carb-Cholecalciferol (CALCIUM CARBONATE-VITAMIN D3) 600-400 MG-UNIT TABS Take 1 tablet by mouth daily.   Yes [provider]  fluticasone (FLONASE) 50 MCG/ACT nasal spray Place 2 sprays into both nostrils daily. 08/28/21  Yes Rodriguez-Southworth, Sunday Spillers, PA-C  furosemide (LASIX) 40 MG tablet Take 1 tablet (40 mg total) by mouth daily. 06/24/21  Yes Juline Patch, MD  irbesartan (AVAPRO) 300 MG tablet Take 1 tablet (300 mg total) by mouth daily. 06/24/21  Yes Juline Patch, MD  metFORMIN (GLUCOPHAGE-XR) 500 MG 24 hr tablet Take 1 tablet (500 mg total) by mouth daily. 06/24/21  Yes Juline Patch, MD  metoprolol (TOPROL-XL) 200 MG 24 hr tablet Take 1 tablet (200 mg total) by mouth daily. 06/24/21  Yes Juline Patch, MD  Omega-3 Fatty Acids (FISH OIL) 1000 MG CAPS Take 2 capsules by mouth daily.   Yes [provider]  Semaglutide,0.25 or 0.5MG /DOS, (OZEMPIC, 0.25 OR 0.5 MG/DOSE,) 2 MG/1.5ML SOPN Inject 0.5 mg into the skin once a week. 01/21/21  Yes [provider]  simvastatin (ZOCOR) 20 MG tablet Take 1 tablet (20 mg total) by mouth daily. 06/24/21  Yes Juline Patch, MD  sulfacetamide (BLEPH-10) 10 % ophthalmic solution Place 1-2 drops into the right eye 3 (three) times daily. For 7 days 08/28/21  Yes Rodriguez-Southworth, Sunday Spillers, PA-C  traZODone (DESYREL) 50 MG tablet Take 50 mg by mouth at bedtime.   Yes [provider]    Family History Family History  Problem Relation Age of  Onset   Hypertension Mother    Hypertension Father    Stroke Father    Throat cancer Brother    Breast cancer Maternal Aunt    Pancreatic cancer Maternal Aunt    Breast cancer Niece     Social History Social History   Tobacco Use   Smoking status: Some Days    Packs/day: 0.25    Years: 30.00    Pack years: 7.50    Types: Cigarettes   Smokeless tobacco: Never  Vaping Use   Vaping Use: Never used  Substance Use Topics   Alcohol use: Yes    Alcohol/week: 3.0 standard drinks    Types: 3 Glasses of wine per week    Comment: occ   Drug use: Never     Allergies   Ace inhibitors and Hydrochlorothiazide   Review of Systems Review of Systems  Constitutional:  Positive for chills and fatigue. Negative for activity change, appetite change, diaphoresis and fever.  HENT:  Positive for congestion, ear pain, postnasal drip, rhinorrhea and sore throat. Negative for ear discharge and trouble swallowing.   Eyes:  Positive for discharge and redness. Negative for photophobia.  Respiratory:  Positive for cough.   Musculoskeletal:  Positive for myalgias.  Skin:  Negative for rash.  Neurological:  Negative for headaches.  Hematological:  Negative for adenopathy.    Physical Exam Triage Vital Signs ED Triage Vitals  Enc Vitals Group     BP 08/28/21 0838 (!) 163/65     Pulse Rate 08/28/21 0838 85     Resp 08/28/21 0838 18     Temp 08/28/21 0838 99 F (37.2 C)     Temp Source 08/28/21 0838 Oral     SpO2 08/28/21 0838 97 %     Weight 08/28/21 0836 218 lb 0.6 oz (98.9 kg)     Height 08/28/21 0836 5\' 6"  (1.676 m)     Head Circumference --      Peak Flow --      Pain Score 08/28/21 0835 3     Pain Loc --      Pain Edu? --      Excl. in [place]? --    No data found.  Updated Vital Signs BP (!) 163/65 (BP Location: Left Arm)    Pulse 85    Temp 99 F (37.2 C) (Oral)    Resp 18    Ht 5\' 6"  (1.676 m)    Wt 218 lb 0.6 oz (98.9 kg)    SpO2 97%    BMI 35.19 kg/m   Visual Acuity Right  Eye Distance:   Left Eye Distance:   Bilateral Distance:    Right Eye Near:   Left Eye Near:    Bilateral Near:     Physical Exam Physical Exam Vitals signs and nursing note reviewed.  Constitutional:      General:  She is not in acute distress.    Appearance: Normal appearance. She is not ill-appearing, toxic-appearing or diaphoretic.  HENT:     Head: Normocephalic.     Right Ear: Tympanic membrane, ear canal and external ear normal.     Left Ear: Tympanic membrane, ear canal and external ear normal.     Nose: with moderate mucosa congestion, and clear mucus. Sinuses are not tender. All her sinuses transilluminate well.     Mouth/Throat:     Mouth: Mucous membranes are moist.  Eyes:     General: No scleral icterus.       Right eye: No discharge.        Left eye: No discharge.     Conjunctiva/sclera: Conjunctivae normal.  Neck:     Musculoskeletal: Neck supple. No neck rigidity.  Cardiovascular:     Rate and Rhythm: Normal rate and regular rhythm.     Heart sounds: No murmur.  Pulmonary:     Effort: Pulmonary effort is normal.     Breath sounds: Normal breath sounds.  Musculoskeletal: Normal range of motion.  Lymphadenopathy:     Cervical: No cervical adenopathy.  Skin:    General: Skin is warm and dry.     Coloration: Skin is not jaundiced.     Findings: No rash.  Neurological:     Mental Status: She is alert and oriented to person, place, and time.     Gait: Gait normal.  Psychiatric:        Mood and Affect: Mood normal.        Behavior: Behavior normal.        Thought Content: Thought content normal.        Judgment: Judgment normal.    UC Treatments / Results  Labs (all labs ordered are listed, but only abnormal results are displayed) Labs Reviewed  RESP PANEL BY RT-PCR (FLU A&B, COVID) [id number]   Respiratory panel is negative EKG   Radiology No results found.  Procedures Procedures (including critical care time)  Medications Ordered in  UC Medications - No data to display  Initial Impression / Assessment and Plan / UC Course  I have reviewed the triage vital signs and the nursing notes. Pertinent labs  results that were available during my care of the patient were reviewed by me and considered in my medical decision making (see chart for details). Has URI and R bacterial conjunctivitis She was placed on Flonase and Sodium sulamid ophthalmic gtts as noted    Final Clinical Impressions(s) / UC Diagnoses   Final diagnoses:  Upper respiratory tract infection, unspecified type  Bacterial conjunctivitis of right eye     Discharge Instructions      You have a viral infection, and not bacterial respiratory infection per my exam. Your right eye is getting early bacterial infection and the eye drops will take care of this. Since your blood pressure is so high today, you cant take Sudafed, so I sent Flonase to help the stuffiness. I also recommend for you to do saline nose rinses to clean out the congestion. Watch this video who to do saline nose rinses.  [email or web address]   Do not use tap water, only boiled water that has been cooled down.  Do it twice a day  for 5-7 days, but avoid bed time.  '  I will call you when the results are back.      ED Prescriptions     Medication Sig Dispense Auth. Provider   sulfacetamide (  BLEPH-10) 10 % ophthalmic solution Place 1-2 drops into the right eye 3 (three) times daily. For 7 days 15 mL Rodriguez-Southworth, Sunday Spillers, PA-C   fluticasone (FLONASE) 50 MCG/ACT nasal spray Place 2 sprays into both nostrils daily. 18.2 g Rodriguez-Southworth, Sunday Spillers, PA-C      PDMP not reviewed this encounter.   Shelby Mattocks, PA-C 08/28/21 1146

## 2021-08-28 NOTE — Discharge Instructions (Addendum)
You have a viral infection, and not bacterial respiratory infection per my exam. Your right eye is getting early bacterial infection and the eye drops will take care of this. Since your blood pressure is so high today, you cant take Sudafed, so I sent Flonase to help the stuffiness. I also recommend for you to do saline nose rinses to clean out the congestion. Watch this video who to do saline nose rinses.  [email or web address]   Do not use tap water, only boiled water that has been cooled down.  Do it twice a day  for 5-7 days, but avoid bed time.  '  I will call you when the results are back.

## 2021-08-30 ENCOUNTER — Other Ambulatory Visit: Payer: Self-pay

## 2021-08-30 ENCOUNTER — Ambulatory Visit (INDEPENDENT_AMBULATORY_CARE_PROVIDER_SITE_OTHER): Payer: Managed Care, Other (non HMO)

## 2021-08-30 ENCOUNTER — Ambulatory Visit
Admission: RE | Admit: 2021-08-30 | Discharge: 2021-08-30 | Disposition: A | Discharge status: Home / Self Care | Payer: Managed Care, Other (non HMO) | Source: Ambulatory Visit | Attending: Emergency Medicine | Admitting: Emergency Medicine

## 2021-08-30 VITALS — BP 156/101 | HR 88 | Temp 98.3°F | Resp 15 | Ht 66.0 in | Wt 218.0 lb

## 2021-08-30 DIAGNOSIS — R059 Cough, unspecified: Secondary | ICD-10-CM | POA: Diagnosis not present

## 2021-08-30 DIAGNOSIS — J069 Acute upper respiratory infection, unspecified: Secondary | ICD-10-CM | POA: Diagnosis not present

## 2021-08-30 DIAGNOSIS — R0602 Shortness of breath: Secondary | ICD-10-CM

## 2021-08-30 MED ORDER — AEROCHAMBER MV MISC: 2 refills | Status: DC

## 2021-08-30 MED ORDER — PROMETHAZINE-DM 6.25-15 MG/5ML PO SYRP: 5.0000 mL | ORAL_SOLUTION | Freq: Four times a day (QID) | ORAL | 0 refills | Status: DC | PRN

## 2021-08-30 MED ORDER — BENZONATATE 100 MG PO CAPS: 200.0000 mg | ORAL_CAPSULE | Freq: Three times a day (TID) | ORAL | 0 refills | Status: DC

## 2021-08-30 MED ORDER — ALBUTEROL SULFATE HFA 108 (90 BASE) MCG/ACT IN AERS: 2.0000 | INHALATION_SPRAY | RESPIRATORY_TRACT | 0 refills | Status: DC | PRN

## 2021-08-30 MED ORDER — IPRATROPIUM BROMIDE 0.06 % NA SOLN: 2.0000 | Freq: Four times a day (QID) | NASAL | 12 refills | Status: DC

## 2021-08-30 NOTE — ED Provider Notes (Signed)
[facility]    CSN: [id number] Arrival date & time: 08/30/21  1349      History   Chief Complaint Chief Complaint  Patient presents with   Appointment   Cough    HPI Ayano Douthitt is a 66 y.o. female.   HPI  76-year female here for evaluation of her worsening respiratory symptoms.  Patient was evaluated in this urgent care 2 days ago and diagnosed with a viral URI.  She states that she has not been able to get a rest due to her cough and nasal congestion.  She is also experiencing increasing sore throat and ear pain.  She states that she is getting green nasal discharge out of her naris and also has a productive cough for green opaque sputum.  She denies any fever or wheezing.  She does feel short of breath.  Past Medical History:  Diagnosis Date   Anxiety    Broken arm, left, closed, initial encounter    Chronic insomnia    COPD (chronic obstructive pulmonary disease) ([place])    Diabetes mellitus without complication ([place])    Diverticulosis    Hypertension    Impaired glucose tolerance    Internal hemorrhoids     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   Lymphedema of both lower extremities 10/19/2018   Body mass index (BMI) of 35.0 to 35.9 with comorbidity 07/14/2018   Chronic bilateral low back pain without sciatica 07/14/2018   High risk medication use 06/19/2015   Tubular adenoma of colon 01/29/2015   Diabetes mellitus type II, controlled ([place]) 06/05/2014   Chronic insomnia 02/05/2014   Hyperlipidemia associated with type 2 diabetes mellitus ([place]) 02/05/2014   Hypertension associated with type 2 diabetes mellitus ([place]) 02/05/2014   Plantar fasciitis 02/05/2014    Past Surgical History:  Procedure Laterality Date   AUGMENTATION MAMMAPLASTY Bilateral 2000   COLONOSCOPY WITH PROPOFOL N/A 01/29/2015   Procedure: COLONOSCOPY WITH PROPOFOL;  Surgeon: Lollie Sails, MD;  Location: [facility]OSCOPY;   Service: Endoscopy;  Laterality: N/A;   Hysteroscopy wtih enodmetrial ablation     Saline Breast Implants     SEPTOPLASTY      OB History   No obstetric history on file.      Home Medications    Prior to Admission medications   Medication Sig Start Date End Date Taking? Authorizing Provider  albuterol (VENTOLIN HFA) 108 (90 Base) MCG/ACT inhaler Inhale 2 puffs into the lungs every 4 (four) hours as needed. 08/30/21  Yes Margarette Canada, NP  benzonatate (TESSALON) 100 MG capsule Take 2 capsules (200 mg total) by mouth every 8 (eight) hours. 08/30/21  Yes Margarette Canada, NP  ipratropium (ATROVENT) 0.06 % nasal spray Place 2 sprays into both nostrils 4 (four) times daily. 08/30/21  Yes Margarette Canada, NP  promethazine-dextromethorphan (PROMETHAZINE-DM) 6.25-15 MG/5ML syrup Take 5 mLs by mouth 4 (four) times daily as needed. 08/30/21  Yes Margarette Canada, NP  Spacer/Aero-Holding Chambers (AEROCHAMBER MV) inhaler Use as instructed 08/30/21  Yes Margarette Canada, NP  amLODipine (NORVASC) 5 MG tablet Take 1 tablet (5 mg total) by mouth daily. 06/24/21   Juline Patch, MD  ascorbic acid (VITAMIN C) 500 MG tablet Take 500 mg by mouth daily.    [provider]  Calcium Carb-Cholecalciferol (CALCIUM CARBONATE-VITAMIN D3) 600-400 MG-UNIT TABS Take 1 tablet by mouth daily.    [provider]  fluticasone (FLONASE) 50 MCG/ACT nasal spray Place 2  sprays into both nostrils daily. 08/28/21   Rodriguez-Southworth, Sunday Spillers, PA-C  furosemide (LASIX) 40 MG tablet Take 1 tablet (40 mg total) by mouth daily. 06/24/21   Juline Patch, MD  irbesartan (AVAPRO) 300 MG tablet Take 1 tablet (300 mg total) by mouth daily. 06/24/21   Juline Patch, MD  metFORMIN (GLUCOPHAGE-XR) 500 MG 24 hr tablet Take 1 tablet (500 mg total) by mouth daily. 06/24/21   Juline Patch, MD  metoprolol (TOPROL-XL) 200 MG 24 hr tablet Take 1 tablet (200 mg total) by mouth daily. 06/24/21   Juline Patch, MD  Omega-3 Fatty Acids  (FISH OIL) 1000 MG CAPS Take 2 capsules by mouth daily.    [provider]  Semaglutide,0.25 or 0.5MG /DOS, (OZEMPIC, 0.25 OR 0.5 MG/DOSE,) 2 MG/1.5ML SOPN Inject 0.5 mg into the skin once a week. 01/21/21   [provider]  simvastatin (ZOCOR) 20 MG tablet Take 1 tablet (20 mg total) by mouth daily. 06/24/21   Juline Patch, MD  sulfacetamide (BLEPH-10) 10 % ophthalmic solution Place 1-2 drops into the right eye 3 (three) times daily. For 7 days 08/28/21   Rodriguez-Southworth, Sunday Spillers, PA-C  traZODone (DESYREL) 50 MG tablet Take 50 mg by mouth at bedtime.    [provider]    Family History Family History  Problem Relation Age of Onset   Hypertension Mother    Hypertension Father    Stroke Father    Throat cancer Brother    Breast cancer Maternal Aunt    Pancreatic cancer Maternal Aunt    Breast cancer Niece     Social History Social History   Tobacco Use   Smoking status: Some Days    Packs/day: 0.25    Years: 30.00    Pack years: 7.50    Types: Cigarettes   Smokeless tobacco: Never  Vaping Use   Vaping Use: Never used  Substance Use Topics   Alcohol use: Yes    Alcohol/week: 3.0 standard drinks    Types: 3 Glasses of wine per week    Comment: occ   Drug use: Never     Allergies   Ace inhibitors and Hydrochlorothiazide   Review of Systems Review of Systems  Constitutional:  Positive for fatigue. Negative for activity change, appetite change and fever.  HENT:  Positive for congestion, ear pain, rhinorrhea and sore throat.   Respiratory:  Positive for cough and shortness of breath. Negative for wheezing.   Hematological: Negative.   Psychiatric/Behavioral: Negative.      Physical Exam Triage Vital Signs ED Triage Vitals  Enc Vitals Group     BP 08/30/21 1407 (!) 156/101     Pulse Rate 08/30/21 1407 88     Resp 08/30/21 1407 15     Temp 08/30/21 1407 98.3 F (36.8 C)     Temp Source 08/30/21 1407 Oral     SpO2 08/30/21 1407 97 %      Weight 08/30/21 1405 218 lb 0.6 oz (98.9 kg)     Height 08/30/21 1405 5\' 6"  (1.676 m)     Head Circumference --      Peak Flow --      Pain Score 08/30/21 1405 8     Pain Loc --      Pain Edu? --      Excl. in [place]? --    No data found.  Updated Vital Signs BP (!) 156/101 (BP Location: Right Arm)    Pulse 88  Temp 98.3 F (36.8 C) (Oral)    Resp 15    Ht 5\' 6"  (1.676 m)    Wt 218 lb 0.6 oz (98.9 kg)    SpO2 97%    BMI 35.19 kg/m   Visual Acuity Right Eye Distance:   Left Eye Distance:   Bilateral Distance:    Right Eye Near:   Left Eye Near:    Bilateral Near:     Physical Exam Vitals and nursing note reviewed.  Constitutional:      General: She is not in acute distress.    Appearance: Normal appearance. She is not ill-appearing.  HENT:     Head: Normocephalic and atraumatic.     Right Ear: Tympanic membrane, ear canal and external ear normal. There is no impacted cerumen.     Left Ear: Tympanic membrane, ear canal and external ear normal. There is no impacted cerumen.     Nose: Congestion and rhinorrhea present.     Comments: There is mucosa is erythematous and edematous with green nasal discharge in both nares.    Mouth/Throat:     Mouth: Mucous membranes are moist.     Pharynx: Oropharynx is clear. Posterior oropharyngeal erythema present.  Cardiovascular:     Rate and Rhythm: Normal rate and regular rhythm.     Pulses: Normal pulses.     Heart sounds: Normal heart sounds. No murmur heard.   No friction rub. No gallop.  Pulmonary:     Effort: Pulmonary effort is normal.     Breath sounds: Wheezing present. No rhonchi or rales.  Musculoskeletal:     Cervical back: Normal range of motion and neck supple.  Lymphadenopathy:     Cervical: No cervical adenopathy.  Skin:    General: Skin is warm and dry.     Capillary Refill: Capillary refill takes less than 2 seconds.     Findings: No erythema or rash.  Neurological:     General: No focal deficit present.      Mental Status: She is alert and oriented to person, place, and time.  Psychiatric:        Mood and Affect: Mood normal.        Behavior: Behavior normal.        Thought Content: Thought content normal.        Judgment: Judgment normal.     UC Treatments / Results  Labs (all labs ordered are listed, but only abnormal results are displayed) Labs Reviewed - No data to display  EKG   Radiology DG Chest 2 View  Result Date: 08/30/2021 CLINICAL DATA:  Worsening cough and shortness of breath. EXAM: CHEST - 2 VIEW COMPARISON:  Chest CT, 11/06/2008. FINDINGS: Cardiac silhouette is normal in size and configuration. Normal mediastinal and hilar contours. Lungs are hyperexpanded, but clear. No pleural effusion or pneumothorax. Skeletal structures are intact. IMPRESSION: No active cardiopulmonary disease. Electronically Signed   By: Lajean Manes M.D.   On: 08/30/2021 15:22    Procedures Procedures (including critical care time)  Medications Ordered in UC Medications - No data to display  Initial Impression / Assessment and Plan / UC Course  I have reviewed the triage vital signs and the nursing notes.  Pertinent labs & imaging results that were available during my care of the patient were reviewed by me and considered in my medical decision making (see chart for details).  Patient is a nontoxic-appearing 66 year old female here for evaluation of worsening respiratory symptoms as outlined in HPI  above.  Her predominant complaint is her cough and congestion which is making it hard for her to breathe and sleep.  She has been using the Flonase nasal spray that she was previously prescribed without any relief of symptoms.  On physical exam patient is pearly gray tympanic membranes bilaterally with normal light reflex and clear external auditory canals.  Nasal mucosa is erythematous and edematous with green nasal discharge in both nares.  Oropharyngeal exam reveals mild posterior oropharyngeal  erythema with clear postnasal drip.  No cervical lymphadenopathy appreciated on exam.  Cardiopulmonary exam reveals expiratory wheezes in the right lung base but the remainder the lung fields are clear to auscultation.  Will obtain chest x-ray to rule out the presence of pneumonia.  Independently reviewed and evaluated by me.  Impression: Lung fields are well pneumatized.  Costophrenic angles are crisp.  There is a questionable streaky infiltrate in the right lung base.  Radiology read is pending. Radiology impression is negative cardiopulmonary disease.  Lungs are hyperexpanded but clear.  Will discharge patient home with a diagnosis of viral URI with a cough.  We will give Atrovent nasal spray to help with nasal congestion, Tessalon Perles help with cough for the day, and Promethazine DM cough syrup to help with cough at bedtime.  Also prescribed an albuterol inhaler with a spacer for her to use as needed for shortness of breath and wheezing.  Final Clinical Impressions(s) / UC Diagnoses   Final diagnoses:  Viral URI with cough     Discharge Instructions      Use the Atrovent nasal spray, 2 squirts in each nostril every 6 hours, as needed for runny nose and postnasal drip.  Use the Tessalon Perles every 8 hours during the day.  Take them with a small sip of water.  They may give you some numbness to the base of your tongue or a metallic taste in your mouth, this is normal.  Use the Promethazine DM cough syrup at bedtime for cough and congestion.  It will make you drowsy so do not take it during the day.  Use the albuterol inhaler with a spacer, 2 puffs every 4-6 hours, as needed for shortness of breath and wheezing.  Return for reevaluation or see your primary care provider for any new or worsening symptoms.      ED Prescriptions     Medication Sig Dispense Auth. Provider   albuterol (VENTOLIN HFA) 108 (90 Base) MCG/ACT inhaler Inhale 2 puffs into the lungs every 4 (four) hours as  needed. 18 g Margarette Canada, NP   Spacer/Aero-Holding Chambers (AEROCHAMBER MV) inhaler Use as instructed 1 each Margarette Canada, NP   benzonatate (TESSALON) 100 MG capsule Take 2 capsules (200 mg total) by mouth every 8 (eight) hours. 21 capsule Margarette Canada, NP   ipratropium (ATROVENT) 0.06 % nasal spray Place 2 sprays into both nostrils 4 (four) times daily. 15 mL Margarette Canada, NP   promethazine-dextromethorphan (PROMETHAZINE-DM) 6.25-15 MG/5ML syrup Take 5 mLs by mouth 4 (four) times daily as needed. 118 mL Margarette Canada, NP      PDMP not reviewed this encounter.   Margarette Canada, NP 08/30/21 1534

## 2021-08-30 NOTE — Discharge Instructions (Addendum)
Use the Atrovent nasal spray, 2 squirts in each nostril every 6 hours, as needed for runny nose and postnasal drip.  Use the Tessalon Perles every 8 hours during the day.  Take them with a small sip of water.  They may give you some numbness to the base of your tongue or a metallic taste in your mouth, this is normal.  Use the Promethazine DM cough syrup at bedtime for cough and congestion.  It will make you drowsy so do not take it during the day.  Use the albuterol inhaler with a spacer, 2 puffs every 4-6 hours, as needed for shortness of breath and wheezing.  Return for reevaluation or see your primary care provider for any new or worsening symptoms.

## 2021-08-30 NOTE — ED Triage Notes (Signed)
Patient c/o cough and chest congestion that has gotten worse.  Patient reports SOB.  Patient denies fevers.  Patient was seen on 08/28/21 for URI and was given flonase nasal spray.

## 2021-09-24 ENCOUNTER — Encounter: Payer: Self-pay | Admitting: Family Medicine

## 2021-09-24 ENCOUNTER — Other Ambulatory Visit: Payer: Self-pay

## 2021-09-24 ENCOUNTER — Ambulatory Visit (INDEPENDENT_AMBULATORY_CARE_PROVIDER_SITE_OTHER): Payer: Managed Care, Other (non HMO) | Admitting: Family Medicine

## 2021-09-24 VITALS — BP 120/80 | HR 76 | Ht 66.0 in | Wt 215.0 lb

## 2021-09-24 DIAGNOSIS — F17210 Nicotine dependence, cigarettes, uncomplicated: Secondary | ICD-10-CM

## 2021-09-24 DIAGNOSIS — Z124 Encounter for screening for malignant neoplasm of cervix: Secondary | ICD-10-CM

## 2021-09-24 DIAGNOSIS — E785 Hyperlipidemia, unspecified: Secondary | ICD-10-CM

## 2021-09-24 DIAGNOSIS — E119 Type 2 diabetes mellitus without complications: Secondary | ICD-10-CM | POA: Diagnosis not present

## 2021-09-24 DIAGNOSIS — E1169 Type 2 diabetes mellitus with other specified complication: Secondary | ICD-10-CM | POA: Diagnosis not present

## 2021-09-24 DIAGNOSIS — E1159 Type 2 diabetes mellitus with other circulatory complications: Secondary | ICD-10-CM

## 2021-09-24 DIAGNOSIS — I152 Hypertension secondary to endocrine disorders: Secondary | ICD-10-CM

## 2021-09-24 DIAGNOSIS — J4520 Mild intermittent asthma, uncomplicated: Secondary | ICD-10-CM

## 2021-09-24 MED ORDER — AEROCHAMBER MV MISC: 2 refills | Status: DC

## 2021-09-24 MED ORDER — SIMVASTATIN 20 MG PO TABS: 20.0000 mg | ORAL_TABLET | Freq: Every day | ORAL | 1 refills | Status: DC

## 2021-09-24 MED ORDER — METOPROLOL SUCCINATE ER 200 MG PO TB24: 200.0000 mg | ORAL_TABLET | Freq: Every day | ORAL | 1 refills | Status: DC

## 2021-09-24 MED ORDER — OZEMPIC (0.25 OR 0.5 MG/DOSE) 2 MG/1.5ML ~~LOC~~ SOPN: 0.5000 mg | PEN_INJECTOR | SUBCUTANEOUS | 1 refills | Status: DC

## 2021-09-24 MED ORDER — ALBUTEROL SULFATE HFA 108 (90 BASE) MCG/ACT IN AERS: 2.0000 | INHALATION_SPRAY | RESPIRATORY_TRACT | 0 refills | Status: DC | PRN

## 2021-09-24 MED ORDER — FUROSEMIDE 40 MG PO TABS: 40.0000 mg | ORAL_TABLET | Freq: Every day | ORAL | 1 refills | Status: DC

## 2021-09-24 MED ORDER — METFORMIN HCL ER 500 MG PO TB24: 500.0000 mg | ORAL_TABLET | Freq: Every day | ORAL | 1 refills | Status: DC

## 2021-09-24 MED ORDER — AMLODIPINE BESYLATE 5 MG PO TABS: 5.0000 mg | ORAL_TABLET | Freq: Every day | ORAL | 1 refills | Status: DC

## 2021-09-24 NOTE — Patient Instructions (Signed)
Managing the Challenge of Quitting Smoking ?Quitting smoking is a physical and mental challenge. You will face cravings, withdrawal symptoms, and temptation. Before quitting, work with your health care provider to make a plan that can help you manage quitting. Preparation can help you quit and keep you from giving in. ?How to manage lifestyle changes ?Managing stress ?Stress can make you want to smoke, and wanting to smoke may cause stress. It is important to find ways to manage your stress. You might try some of the following: ?Practice relaxation techniques. ?Breathe slowly and deeply, in through your nose and out through your mouth. ?Listen to music. ?Soak in a bath or take a shower. ?Imagine a peaceful place or vacation. ?Get some support. ?Talk with family or friends about your stress. ?Join a support group. ?Talk with a counselor or therapist. ?Get some physical activity. ?Go for a walk, run, or bike ride. ?Play a favorite sport. ?Practice yoga. ? ?Medicines ?Talk with your health care provider about medicines that might help you deal with cravings and make quitting easier for you. ?Relationships ?Social situations can be difficult when you are quitting smoking. To manage this, you can: ?Avoid parties and other social situations where people might be smoking. ?Avoid alcohol. ?Leave right away if you have the urge to smoke. ?Explain to your family and friends that you are quitting smoking. Ask for support and let them know you might be a bit grumpy. ?Plan activities where smoking is not an option. ?General instructions ?Be aware that many people gain weight after they quit smoking. However, not everyone does. To keep from gaining weight, have a plan in place before you quit and stick to the plan after you quit. Your plan should include: ?Having healthy snacks. When you have a craving, it may help to: ?Eat popcorn, carrots, celery, or other cut vegetables. ?Chew sugar-free gum. ?Changing how you eat. ?Eat small  portion sizes at meals. ?Eat 4-6 small meals throughout the day instead of 1-2 large meals a day. ?Be mindful when you eat. Do not watch television or do other things that might distract you as you eat. ?Exercising regularly. ?Make time to exercise each day. If you do not have time for a long workout, do short bouts of exercise for 5-10 minutes several times a day. ?Do some form of strengthening exercise, such as weight lifting. ?Do some exercise that gets your heart beating and causes you to breathe deeply, such as walking fast, running, swimming, or biking. This is very important. ?Drinking plenty of water or other low-calorie or no-calorie drinks. Drink 6-8 glasses of water daily. ? ?How to recognize withdrawal symptoms ?Your body and mind may experience discomfort as you try to get used to not having nicotine in your system. These effects are called withdrawal symptoms. They may include: ?Feeling hungrier than normal. ?Having trouble concentrating. ?Feeling irritable or restless. ?Having trouble sleeping. ?Feeling depressed. ?Craving a cigarette. ?To manage withdrawal symptoms: ?Avoid places, people, and activities that trigger your cravings. ?Remember why you want to quit. ?Get plenty of sleep. ?Avoid coffee and other caffeinated drinks. These may worsen some of your symptoms. ?These symptoms may surprise you. But be assured that they are normal to have when quitting smoking. ?How to manage cravings ?Come up with a plan for how to deal with your cravings. The plan should include the following: ?A definition of the specific situation you want to deal with. ?An alternative action you will take. ?A clear idea for how this action   will help. ?The name of someone who might help you with this. ?Cravings usually last for 5-10 minutes. Consider taking the following actions to help you with your plan to deal with cravings: ?Keep your mouth busy. ?Chew sugar-free gum. ?Suck on hard candies or a straw. ?Brush your  teeth. ?Keep your hands and body busy. ?Change to a different activity right away. ?Squeeze or play with a ball. ?Do an activity or a hobby, such as making bead jewelry, practicing needlepoint, or working with wood. ?Mix up your normal routine. ?Take a short exercise break. Go for a quick walk or run up and down stairs. ?Focus on doing something kind or helpful for someone else. ?Call a friend or family member to talk during a craving. ?Join a support group. ?Contact a quitline. ?Where to find support ?To get help or find a support group: ?Call the National Cancer Institute's Smoking Quitline: 1-800-QUIT NOW (784-8669) ?Visit the website of the Substance Abuse and Mental Health Services Administration: www.samhsa.gov ?Text QUIT to SmokefreeTXT: 478848 ?Where to find more information ?Visit these websites to find more information on quitting smoking: ?National Cancer Institute: www.smokefree.gov ?American Lung Association: www.lung.org ?American Cancer Society: www.cancer.org ?Centers for Disease Control and Prevention: www.cdc.gov ?American Heart Association: www.heart.org ?Contact a health care provider if: ?You want to change your plan for quitting. ?The medicines you are taking are not helping. ?Your eating feels out of control or you cannot sleep. ?Get help right away if: ?You feel depressed or become very anxious. ?Summary ?Quitting smoking is a physical and mental challenge. You will face cravings, withdrawal symptoms, and temptation to smoke again. Preparation can help you as you go through these challenges. ?Try different techniques to manage stress, handle social situations, and prevent weight gain. ?You can deal with cravings by keeping your mouth busy (such as by chewing gum), keeping your hands and body busy, calling family or friends, or contacting a quitline for people who want to quit smoking. ?You can deal with withdrawal symptoms by avoiding places where people smoke, getting plenty of rest, and  avoiding drinks with caffeine. ?This information is not intended to replace advice given to you by your health care provider. Make sure you discuss any questions you have with your health care provider. ?Document Revised: 03/28/2021 Document Reviewed: 05/09/2019 ?Elsevier Patient Education ? 2022 [employer] ? ?

## 2021-09-24 NOTE — Progress Notes (Signed)
Date:  09/24/2021   Name:  Barbara Cortez   DOB:  09-Aug-1955   MRN:  [id number]   Chief Complaint: Diabetes and Hyperlipidemia  Diabetes She presents for her follow-up diabetic visit. She has type 2 diabetes mellitus. Her disease course has been stable. There are no hypoglycemic associated symptoms. Pertinent negatives for hypoglycemia include no dizziness, headaches or nervousness/anxiousness. There are no diabetic associated symptoms. Pertinent negatives for diabetes include no chest pain and no polydipsia. There are no hypoglycemic complications. Symptoms are stable. There are no diabetic complications. Current diabetic treatment includes oral agent (monotherapy) (ozempic). Her weight is fluctuating minimally. She is following a generally healthy diet. Meal planning includes avoidance of concentrated sweets.  Hyperlipidemia This is a chronic problem. The current episode started more than 1 year ago. The problem is controlled. Recent lipid tests were reviewed and are normal. She has no history of chronic renal disease, diabetes, hypothyroidism, liver disease, obesity or nephrotic syndrome. Pertinent negatives include no chest pain, myalgias or shortness of breath. The current treatment provides moderate improvement of lipids. There are no compliance problems.   Hypertension This is a chronic problem. The current episode started more than 1 year ago. The problem has been gradually improving since onset. The problem is controlled. Pertinent negatives include no chest pain, headaches, neck pain, palpitations or shortness of breath. Past treatments include calcium channel blockers, diuretics and beta blockers. The current treatment provides moderate improvement. There are no compliance problems.  There is no history of chronic renal disease.  Asthma She complains of cough, sputum production and wheezing. There is no shortness of breath. This is a chronic problem. The problem occurs  intermittently. Pertinent negatives include no chest pain, ear pain, fever, headaches, myalgias or sore throat. Her past medical history is significant for asthma.   Lab Results  Component Value Date   NA 143 06/24/2021   K 4.3 06/24/2021   CO2 28 06/24/2021   GLUCOSE 97 06/24/2021   BUN 11 06/24/2021   CREATININE 0.76 06/24/2021   CALCIUM 10.2 06/24/2021   EGFR 87 06/24/2021   Lab Results  Component Value Date   CHOL 208 (H) 06/24/2021   HDL 60 06/24/2021   LDLCALC 125 (H) 06/24/2021   TRIG 132 06/24/2021   Lab Results  Component Value Date   TSH 1.650 06/24/2021   Lab Results  Component Value Date   HGBA1C 5.9 (H) 06/24/2021   No results found for: WBC, HGB, HCT, MCV, PLT No results found for: ALT, AST, GGT, ALKPHOS, BILITOT No results found for: 25OHVITD2, 25OHVITD3, VD25OH   Review of Systems  Constitutional:  Negative for chills and fever.  HENT:  Negative for drooling, ear discharge, ear pain and sore throat.   Respiratory:  Positive for cough, sputum production and wheezing. Negative for shortness of breath.   Cardiovascular:  Negative for chest pain, palpitations and leg swelling.  Gastrointestinal:  Negative for abdominal pain, blood in stool, constipation, diarrhea and nausea.  Endocrine: Negative for polydipsia.  Genitourinary:  Negative for dysuria, frequency, hematuria and urgency.  Musculoskeletal:  Negative for back pain, myalgias and neck pain.  Skin:  Negative for rash.  Allergic/Immunologic: Negative for environmental allergies.  Neurological:  Negative for dizziness and headaches.  Hematological:  Does not bruise/bleed easily.  Psychiatric/Behavioral:  Negative for suicidal ideas. The patient is not nervous/anxious.    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   Lymphedema of both lower  extremities 10/19/2018   Body mass index (BMI) of 35.0 to 35.9 with comorbidity 07/14/2018   Chronic bilateral low back pain  without sciatica 07/14/2018   High risk medication use 06/19/2015   Tubular adenoma of colon 01/29/2015   Diabetes mellitus type II, controlled ([place]) 06/05/2014   Chronic insomnia 02/05/2014   Hyperlipidemia associated with type 2 diabetes mellitus ([place]) 02/05/2014   Hypertension associated with type 2 diabetes mellitus ([place]) 02/05/2014   Plantar fasciitis 02/05/2014    Allergies  Allergen Reactions   Ace Inhibitors Cough   Hydrochlorothiazide Itching    Photosensitivity    Past Surgical History:  Procedure Laterality Date   AUGMENTATION MAMMAPLASTY Bilateral 2000   COLONOSCOPY WITH PROPOFOL N/A 01/29/2015   Procedure: COLONOSCOPY WITH PROPOFOL;  Surgeon: Lollie Sails, MD;  Location: [facility] ENDOSCOPY;  Service: Endoscopy;  Laterality: N/A;   Hysteroscopy wtih enodmetrial ablation     Saline Breast Implants     SEPTOPLASTY      Social History   Tobacco Use   Smoking status: Some Days    Packs/day: 0.25    Years: 30.00    Pack years: 7.50    Types: Cigarettes   Smokeless tobacco: Never  Vaping Use   Vaping Use: Never used  Substance Use Topics   Alcohol use: Yes    Alcohol/week: 3.0 standard drinks    Types: 3 Glasses of wine per week    Comment: occ   Drug use: Never     Medication list has been reviewed and updated.  Current Meds  Medication Sig   amLODipine (NORVASC) 5 MG tablet Take 1 tablet (5 mg total) by mouth daily.   ascorbic acid (VITAMIN C) 500 MG tablet Take 500 mg by mouth daily.   Calcium Carb-Cholecalciferol (CALCIUM CARBONATE-VITAMIN D3) 600-400 MG-UNIT TABS Take 1 tablet by mouth daily.   furosemide (LASIX) 40 MG tablet Take 1 tablet (40 mg total) by mouth daily.   irbesartan (AVAPRO) 300 MG tablet Take 1 tablet (300 mg total) by mouth daily.   metFORMIN (GLUCOPHAGE-XR) 500 MG 24 hr tablet Take 1 tablet (500 mg total) by mouth daily.   metoprolol (TOPROL-XL) 200 MG 24 hr tablet Take 1 tablet (200 mg total) by mouth daily.   Omega-3 Fatty  Acids (FISH OIL) 1000 MG CAPS Take 2 capsules by mouth daily.   Semaglutide,0.25 or 0.5MG/DOS, (OZEMPIC, 0.25 OR 0.5 MG/DOSE,) 2 MG/1.5ML SOPN Inject 0.5 mg into the skin once a week.   simvastatin (ZOCOR) 20 MG tablet Take 1 tablet (20 mg total) by mouth daily.   traZODone (DESYREL) 50 MG tablet Take 50 mg by mouth at bedtime.    PHQ 2/9 Scores 09/24/2021 06/24/2021  PHQ - 2 Score 0 0  PHQ- 9 Score 0 2    GAD 7 : Generalized Anxiety Score 09/24/2021 06/24/2021  Nervous, Anxious, on Edge 0 0  Control/stop worrying 0 0  Worry too much - different things 0 0  Trouble relaxing 0 0  Restless 0 0  Easily annoyed or irritable 0 0  Afraid - awful might happen 0 0  Total GAD 7 Score 0 0  Anxiety Difficulty Not difficult at all Not difficult at all    BP Readings from Last 3 Encounters:  09/24/21 120/80  08/30/21 (!) 156/101  08/28/21 (!) 163/65    Physical Exam Vitals and nursing note reviewed.  Constitutional:      Appearance: She is well-developed.  HENT:     Head: Normocephalic.  Right Ear: Tympanic membrane and external ear normal.     Left Ear: Tympanic membrane and external ear normal.     Nose: Nose normal.     Mouth/Throat:     Mouth: Mucous membranes are moist.  Eyes:     General: Lids are everted, no foreign bodies appreciated. No scleral icterus.       Left eye: No foreign body or hordeolum.     Conjunctiva/sclera: Conjunctivae normal.     Right eye: Right conjunctiva is not injected.     Left eye: Left conjunctiva is not injected.     Pupils: Pupils are equal, round, and reactive to light.  Neck:     Thyroid: No thyromegaly.     Vascular: No JVD.     Trachea: No tracheal deviation.  Cardiovascular:     Rate and Rhythm: Normal rate and regular rhythm.     Heart sounds: Normal heart sounds. No murmur heard.   No friction rub. No gallop.  Pulmonary:     Effort: Pulmonary effort is normal. No respiratory distress.     Breath sounds: Normal breath sounds. No  wheezing, rhonchi or rales.  Abdominal:     General: Bowel sounds are normal.     Palpations: Abdomen is soft. There is no mass.     Tenderness: There is no abdominal tenderness. There is no guarding or rebound.  Musculoskeletal:        General: No tenderness. Normal range of motion.     Cervical back: Normal range of motion and neck supple.  Lymphadenopathy:     Cervical: No cervical adenopathy.  Skin:    General: Skin is warm.     Findings: No rash.  Neurological:     Mental Status: She is alert.  Psychiatric:        Mood and Affect: Mood is not anxious or depressed.    Wt Readings from Last 3 Encounters:  09/24/21 215 lb (97.5 kg)  08/30/21 218 lb 0.6 oz (98.9 kg)  08/28/21 218 lb 0.6 oz (98.9 kg)    BP 120/80    Pulse 76    Ht 5' 6"  (1.676 m)    Wt 215 lb (97.5 kg)    BMI 34.70 kg/m   Assessment and Plan:  1. Hypertension associated with type 2 diabetes mellitus (HCC) Chronic.  Controlled.  Stable.  Blood pressure 120/80.  Continue metoprolol XL 200 mg, Lasix 40 mg, and amlodipine 5 mg. - metoprolol (TOPROL-XL) 200 MG 24 hr tablet; Take 1 tablet (200 mg total) by mouth daily.  Dispense: 90 tablet; Refill: 1 - furosemide (LASIX) 40 MG tablet; Take 1 tablet (40 mg total) by mouth daily.  Dispense: 90 tablet; Refill: 1 - amLODipine (NORVASC) 5 MG tablet; Take 1 tablet (5 mg total) by mouth daily.  Dispense: 90 tablet; Refill: 1  2. Controlled type 2 diabetes mellitus without complication, without long-term current use of insulin (HCC) Chronic.  Controlled.  Stable.  Patient's had reasonable control but has now continuing on metformin XR 500 mg once a day and Ozempic 0.5 once a week.  Will check A1c and microalbuminuria for current status. - metFORMIN (GLUCOPHAGE-XR) 500 MG 24 hr tablet; Take 1 tablet (500 mg total) by mouth daily.  Dispense: 90 tablet; Refill: 1 - HgB A1c - Microalbumin, urine - Semaglutide,0.25 or 0.5MG/DOS, (OZEMPIC, 0.25 OR 0.5 MG/DOSE,) 2 MG/1.5ML SOPN;  Inject 0.5 mg into the skin once a week.  Dispense: 4.5 mL; Refill: 1  3. Hyperlipidemia  associated with type 2 diabetes mellitus (HCC) Chronic.  Controlled.  Stable.  Continue simvastatin 20 mg once a day. - simvastatin (ZOCOR) 20 MG tablet; Take 1 tablet (20 mg total) by mouth daily.  Dispense: 90 tablet; Refill: 1  4. Mild intermittent reactive airway disease without complication Chronic.  Episodic.  Stable.  Increased expiratory to inspiratory ratio.  Refill albuterol with spacer. - Spacer/Aero-Holding Chambers (AEROCHAMBER MV) inhaler; Use as instructed  Dispense: 1 each; Refill: 2 - albuterol (VENTOLIN HFA) 108 (90 Base) MCG/ACT inhaler; Inhale 2 puffs into the lungs every 4 (four) hours as needed.  Dispense: 18 g; Refill: 0  5. Cervical cancer screening Referral to GYN for cervical cancer screening and gynecologic maintenance. - Ambulatory referral to Gynecology  6. Cigarette nicotine dependence without complication Patient has been advised of the health risks of smoking and counseled concerning cessation of tobacco products. I spent over 3 minutes for discussion and to answer questions.

## 2021-09-25 LAB — HEMOGLOBIN A1C
Est. average glucose Bld gHb Est-mCnc: 131 mg/dL
Hgb A1c MFr Bld: 6.2 % — ABNORMAL HIGH (ref 4.8–5.6)

## 2021-09-25 LAB — MICROALBUMIN, URINE: Microalbumin, Urine: 6.1 ug/mL

## 2021-10-20 ENCOUNTER — Other Ambulatory Visit: Payer: Self-pay | Admitting: Family Medicine

## 2021-10-20 DIAGNOSIS — Z1231 Encounter for screening mammogram for malignant neoplasm of breast: Secondary | ICD-10-CM

## 2021-10-22 ENCOUNTER — Other Ambulatory Visit (HOSPITAL_COMMUNITY)
Admission: RE | Admit: 2021-10-22 | Discharge: 2021-10-22 | Disposition: A | Discharge status: Home / Self Care | Payer: Managed Care, Other (non HMO) | Source: Ambulatory Visit | Attending: Advanced Practice Midwife | Admitting: Advanced Practice Midwife

## 2021-10-22 ENCOUNTER — Encounter: Payer: Self-pay | Admitting: Advanced Practice Midwife

## 2021-10-22 ENCOUNTER — Other Ambulatory Visit: Payer: Self-pay

## 2021-10-22 ENCOUNTER — Ambulatory Visit (INDEPENDENT_AMBULATORY_CARE_PROVIDER_SITE_OTHER): Payer: Managed Care, Other (non HMO) | Admitting: Advanced Practice Midwife

## 2021-10-22 VITALS — BP 176/104 | HR 91 | Ht 66.0 in | Wt 219.0 lb

## 2021-10-22 DIAGNOSIS — Z124 Encounter for screening for malignant neoplasm of cervix: Secondary | ICD-10-CM | POA: Diagnosis present

## 2021-10-22 DIAGNOSIS — Z0000 Encounter for general adult medical examination without abnormal findings: Secondary | ICD-10-CM

## 2021-10-22 DIAGNOSIS — N3941 Urge incontinence: Secondary | ICD-10-CM | POA: Diagnosis not present

## 2021-10-22 DIAGNOSIS — R030 Elevated blood-pressure reading, without diagnosis of hypertension: Secondary | ICD-10-CM

## 2021-10-22 NOTE — Progress Notes (Addendum)
? ?Patient ID: Barbara Cortez, female   DOB: June 08, 1956, 66 y.o.   MRN: [id number] ? ?Reason for Consult: Gynecologic Exam (Breast exam and Pelvic exam/pap smear/) ?  ?Referred by Juline Patch, MD ? ?Subjective:  ?HPI: ? ?Barbara Cortez is a 66 y.o. G2 P2 female being seen on referral for cervical cancer screening. Patient reports last PAP was around 5 years ago was normal and done at [facility]. She denies breast or gyn concerns. She does not have concern for STDs. She mentions urgency urine leakage, stating, "if I don't go right away, I can't hold it". She thinks her blood pressure may be elevated today due to being here. She took her usual dose of BP medication this morning and denies headache or blurry vision. ? ?Past Medical History:  ?Diagnosis Date  ? Anxiety   ? Broken arm, left, closed, initial encounter   ? Chronic insomnia   ? COPD (chronic obstructive pulmonary disease) ([place])   ? Diabetes mellitus without complication ([place])   ? Diverticulosis   ? Hypertension   ? Impaired glucose tolerance   ? Internal hemorrhoids   ? ?Family History  ?Problem Relation Age of Onset  ? Hypertension Mother   ? Hypertension Father   ? Stroke Father   ? Throat cancer Brother   ? Breast cancer Maternal Aunt   ? Pancreatic cancer Maternal Aunt   ? Breast cancer Niece   ? ?Past Surgical History:  ?Procedure Laterality Date  ? AUGMENTATION MAMMAPLASTY Bilateral 2000  ? COLONOSCOPY WITH PROPOFOL N/A 01/29/2015  ? Procedure: COLONOSCOPY WITH PROPOFOL;  Surgeon: Lollie Sails, MD;  Location: [facility] ENDOSCOPY;  Service: Endoscopy;  Laterality: N/A;  ? Hysteroscopy wtih enodmetrial ablation    ? Saline Breast Implants    ? SEPTOPLASTY    ? ? ?Short Social History:  ?Social History  ? ?Tobacco Use  ? Smoking status: Some Days  ?  Packs/day: 0.25  ?  Years: 30.00  ?  Pack years: 7.50  ?  Types: Cigarettes  ? Smokeless tobacco: Never  ?Substance Use Topics  ? Alcohol use: Yes  ?  Alcohol/week: 3.0 standard drinks  ?   Types: 3 Glasses of wine per week  ?  Comment: occ  ? ? ?Allergies  ?Allergen Reactions  ? Ace Inhibitors Cough  ? Hydrochlorothiazide Itching  ?  Photosensitivity  ? ? ?Current Outpatient Medications  ?Medication Sig Dispense Refill  ? amLODipine (NORVASC) 5 MG tablet Take 1 tablet (5 mg total) by mouth daily. 90 tablet 1  ? ascorbic acid (VITAMIN C) 500 MG tablet Take 500 mg by mouth daily.    ? Calcium Carb-Cholecalciferol (CALCIUM CARBONATE-VITAMIN D3) 600-400 MG-UNIT TABS Take 1 tablet by mouth daily.    ? furosemide (LASIX) 40 MG tablet Take 1 tablet (40 mg total) by mouth daily. 90 tablet 1  ? irbesartan (AVAPRO) 300 MG tablet Take 1 tablet (300 mg total) by mouth daily. 90 tablet 1  ? metFORMIN (GLUCOPHAGE-XR) 500 MG 24 hr tablet Take 1 tablet (500 mg total) by mouth daily. 90 tablet 1  ? metoprolol (TOPROL-XL) 200 MG 24 hr tablet Take 1 tablet (200 mg total) by mouth daily. 90 tablet 1  ? Omega-3 Fatty Acids (FISH OIL) 1000 MG CAPS Take 2 capsules by mouth daily.    ? Semaglutide,0.25 or 0.'5MG'$ /DOS, (OZEMPIC, 0.25 OR 0.5 MG/DOSE,) 2 MG/1.5ML SOPN Inject 0.5 mg into the skin once a week. 4.5 mL 1  ? simvastatin (ZOCOR) 20  MG tablet Take 1 tablet (20 mg total) by mouth daily. 90 tablet 1  ? Spacer/Aero-Holding Chambers (AEROCHAMBER MV) inhaler Use as instructed 1 each 2  ? traZODone (DESYREL) 50 MG tablet Take 50 mg by mouth at bedtime.    ? albuterol (VENTOLIN HFA) 108 (90 Base) MCG/ACT inhaler Inhale 2 puffs into the lungs every 4 (four) hours as needed. (Patient not taking: Reported on 10/22/2021) 18 g 0  ? ?No current facility-administered medications for this visit.  ? ? ?Review of Systems  ?Constitutional:  Negative for chills and fever.  ?HENT:  Negative for congestion, ear discharge, ear pain, hearing loss, sinus pain and sore throat.   ?Eyes:  Negative for blurred vision and double vision.  ?Respiratory:  Negative for cough, shortness of breath and wheezing.   ?Cardiovascular:  Negative for chest pain,  palpitations and leg swelling.  ?Gastrointestinal:  Negative for abdominal pain, blood in stool, constipation, diarrhea, heartburn, melena, nausea and vomiting.  ?Genitourinary:  Positive for urgency. Negative for dysuria, flank pain, frequency and hematuria.  ?Musculoskeletal:  Negative for back pain, joint pain and myalgias.  ?Skin:  Negative for itching and rash.  ?Neurological:  Negative for dizziness, tingling, tremors, sensory change, speech change, focal weakness, seizures, loss of consciousness, weakness and headaches.  ?Endo/Heme/Allergies:  Negative for environmental allergies. Does not bruise/bleed easily.  ?Psychiatric/Behavioral:  Negative for depression, hallucinations, memory loss, substance abuse and suicidal ideas. The patient is nervous/anxious. The patient does not have insomnia.   ? ? ?   ?Objective:  ?Objective  ? ?Vitals:  ? 10/22/21 0903  ?BP: (!) 176/104  ?Pulse: 91  ?Weight: 219 lb (99.3 kg)  ?Height: '5\' 6"'$  (1.676 m)  ? ?Body mass index is 35.35 kg/m?Marland Kitchen ?Constitutional: Well nourished, well developed female in no acute distress.  ?HEENT: normal ?Skin: Warm and dry.  ?Breast: symmetrical, non-tender, no masses, implants present ?Cardiovascular: Regular rate and rhythm.   ?Respiratory: Clear to auscultation bilateral. Normal respiratory effort ?Neuro: DTRs 2+, Cranial nerves grossly intact ?Psych: Alert and Oriented x3. No memory deficits. Normal mood and affect.  ?MS: normal gait, normal bilateral lower extremity ROM/strength/stability. ? ?Pelvic exam: ?is limited by body habitus ?EGBUS: within normal limits ?Vagina: within normal limits and with normal mucosa, no evidence of prolapse ?Cervix: able to see only the edge of the cervix which has a normal appearance ? ?Last PAP: 06/19/2015 normal ?Mammogram: 10/30/2020 BR1 ?Assessment/Plan:  ?  ? ?66 y.o. G2 P2 female screening for cervical cancer, normal breast exam, urgency incontinence ? ?PAP: follow up as needed after lab results ?Mammogram:  ordered by Dr Ronnald Ramp ?Urge incontinence: reviewed treatment recommendations/importance of pelvic floor exercises, referral to pelvic floor PT if desired ?Elevated BP: follow up with Dr Ronnald Ramp as needed ? ?  ? ?Rod Can CNM ?[facility] ?[facility] ?10/22/2021, 9:58 AM ? ? ?

## 2021-10-27 LAB — CYTOLOGY - PAP
Adequacy: ABSENT
Comment: NEGATIVE
Diagnosis: NEGATIVE
High risk HPV: NEGATIVE

## 2021-12-02 ENCOUNTER — Ambulatory Visit
Admission: RE | Admit: 2021-12-02 | Discharge: 2021-12-02 | Disposition: A | Discharge status: Home / Self Care | Payer: Managed Care, Other (non HMO) | Source: Ambulatory Visit | Attending: Family Medicine | Admitting: Family Medicine

## 2021-12-02 DIAGNOSIS — Z1231 Encounter for screening mammogram for malignant neoplasm of breast: Secondary | ICD-10-CM | POA: Insufficient documentation

## 2021-12-23 ENCOUNTER — Other Ambulatory Visit: Payer: Self-pay | Admitting: Family Medicine

## 2022-01-22 ENCOUNTER — Encounter: Payer: Self-pay | Admitting: Family Medicine

## 2022-01-22 ENCOUNTER — Ambulatory Visit (INDEPENDENT_AMBULATORY_CARE_PROVIDER_SITE_OTHER): Payer: Managed Care, Other (non HMO) | Admitting: Family Medicine

## 2022-01-22 ENCOUNTER — Telehealth: Payer: Self-pay | Admitting: Family Medicine

## 2022-01-22 VITALS — BP 128/84 | HR 68 | Ht 66.0 in | Wt 218.0 lb

## 2022-01-22 DIAGNOSIS — J4520 Mild intermittent asthma, uncomplicated: Secondary | ICD-10-CM

## 2022-01-22 DIAGNOSIS — E1169 Type 2 diabetes mellitus with other specified complication: Secondary | ICD-10-CM

## 2022-01-22 DIAGNOSIS — E119 Type 2 diabetes mellitus without complications: Secondary | ICD-10-CM | POA: Diagnosis not present

## 2022-01-22 DIAGNOSIS — E1159 Type 2 diabetes mellitus with other circulatory complications: Secondary | ICD-10-CM

## 2022-01-22 DIAGNOSIS — H1013 Acute atopic conjunctivitis, bilateral: Secondary | ICD-10-CM

## 2022-01-22 DIAGNOSIS — I152 Hypertension secondary to endocrine disorders: Secondary | ICD-10-CM

## 2022-01-22 DIAGNOSIS — E785 Hyperlipidemia, unspecified: Secondary | ICD-10-CM

## 2022-01-22 MED ORDER — IRBESARTAN 300 MG PO TABS: 300.0000 mg | ORAL_TABLET | Freq: Every day | ORAL | 1 refills | Status: DC

## 2022-01-22 MED ORDER — SIMVASTATIN 20 MG PO TABS: 20.0000 mg | ORAL_TABLET | Freq: Every day | ORAL | 1 refills | Status: DC

## 2022-01-22 MED ORDER — FUROSEMIDE 40 MG PO TABS: 40.0000 mg | ORAL_TABLET | Freq: Every day | ORAL | 1 refills | Status: DC

## 2022-01-22 MED ORDER — METOPROLOL SUCCINATE ER 200 MG PO TB24: 200.0000 mg | ORAL_TABLET | Freq: Every day | ORAL | 1 refills | Status: DC

## 2022-01-22 MED ORDER — OLOPATADINE HCL 0.1 % OP SOLN: 1.0000 [drp] | Freq: Two times a day (BID) | OPHTHALMIC | 12 refills | Status: DC

## 2022-01-22 MED ORDER — METFORMIN HCL ER 500 MG PO TB24: 500.0000 mg | ORAL_TABLET | Freq: Every day | ORAL | 1 refills | Status: DC

## 2022-01-22 MED ORDER — ALBUTEROL SULFATE HFA 108 (90 BASE) MCG/ACT IN AERS: 2.0000 | INHALATION_SPRAY | RESPIRATORY_TRACT | 0 refills | Status: DC | PRN

## 2022-01-22 MED ORDER — AMLODIPINE BESYLATE 5 MG PO TABS: 5.0000 mg | ORAL_TABLET | Freq: Every day | ORAL | 1 refills | Status: DC

## 2022-01-22 MED ORDER — OZEMPIC (0.25 OR 0.5 MG/DOSE) 2 MG/1.5ML ~~LOC~~ SOPN: 0.5000 mg | PEN_INJECTOR | SUBCUTANEOUS | 1 refills | Status: DC

## 2022-01-22 NOTE — Progress Notes (Signed)
Date:  01/22/2022   Name:  Barbara Cortez   DOB:  06-20-1956   MRN:  [id number]   Chief Complaint: Prediabetes and Conjunctivitis (L) eye matted )  Conjunctivitis  The current episode started yesterday. The onset is undetermined. The problem has been gradually improving. The problem is mild. Nothing relieves the symptoms. Associated symptoms include eye itching, congestion, rhinorrhea, cough and wheezing. Pertinent negatives include no fever, no decreased vision, no ear discharge and no sore throat. The eye pain is mild.  Hypertension This is a chronic problem. The current episode started more than 1 year ago. The problem has been waxing and waning since onset. The problem is controlled. Pertinent negatives include no blurred vision, chest pain or shortness of breath. Past treatments include beta blockers, calcium channel blockers, diuretics and angiotensin blockers. The current treatment provides moderate improvement. There are no compliance problems.  There is no history of angina, kidney disease, CVA, heart failure, PVD or retinopathy.  Diabetes She presents for her follow-up diabetic visit. She has type 2 diabetes mellitus. Her disease course has been stable. Associated symptoms include polydipsia and weight loss. Pertinent negatives for diabetes include no blurred vision, no chest pain and no polyuria. Symptoms are stable. Pertinent negatives for diabetic complications include no CVA, PVD or retinopathy.    Lab Results  Component Value Date   NA 143 06/24/2021   K 4.3 06/24/2021   CO2 28 06/24/2021   GLUCOSE 97 06/24/2021   BUN 11 06/24/2021   CREATININE 0.76 06/24/2021   CALCIUM 10.2 06/24/2021   EGFR 87 06/24/2021   Lab Results  Component Value Date   CHOL 208 (H) 06/24/2021   HDL 60 06/24/2021   LDLCALC 125 (H) 06/24/2021   TRIG 132 06/24/2021   Lab Results  Component Value Date   TSH 1.650 06/24/2021   Lab Results  Component Value Date   HGBA1C 6.2 (H)  09/24/2021   No results found for: "WBC", "HGB", "HCT", "MCV", "PLT" No results found for: "ALT", "AST", "GGT", "ALKPHOS", "BILITOT" No results found for: "25OHVITD2", "25OHVITD3", "VD25OH"   Review of Systems  Constitutional:  Positive for weight loss. Negative for fever.  HENT:  Positive for congestion, postnasal drip, rhinorrhea and sneezing. Negative for ear discharge, sinus pressure, sinus pain and sore throat.   Eyes:  Positive for itching. Negative for blurred vision.  Respiratory:  Positive for cough and wheezing. Negative for chest tightness and shortness of breath.   Cardiovascular:  Negative for chest pain.  Endocrine: Positive for polydipsia. Negative for polyuria.    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   Lymphedema of both lower extremities 10/19/2018   Body mass index (BMI) of 35.0 to 35.9 with comorbidity 07/14/2018   Chronic bilateral low back pain without sciatica 07/14/2018   High risk medication use 06/19/2015   Tubular adenoma of colon 01/29/2015   Diabetes mellitus type II, controlled ([place]) 06/05/2014   Chronic insomnia 02/05/2014   Hyperlipidemia associated with type 2 diabetes mellitus ([place]) 02/05/2014   Hypertension associated with type 2 diabetes mellitus ([place]) 02/05/2014   Plantar fasciitis 02/05/2014    Allergies  Allergen Reactions   Ace Inhibitors Cough   Hydrochlorothiazide Itching    Photosensitivity    Past Surgical History:  Procedure Laterality Date   AUGMENTATION MAMMAPLASTY Bilateral 2000   COLONOSCOPY WITH PROPOFOL N/A 01/29/2015   Procedure: COLONOSCOPY WITH PROPOFOL;  Surgeon: Lollie Sails, MD;  Location: [facility] ENDOSCOPY;  Service: Endoscopy;  Laterality: N/A;   Hysteroscopy wtih enodmetrial ablation     Saline Breast Implants     SEPTOPLASTY      Social History   Tobacco Use   Smoking status: Some Days    Packs/day: 0.25    Years: 30.00    Total pack years: 7.50    Types:  Cigarettes   Smokeless tobacco: Never  Vaping Use   Vaping Use: Never used  Substance Use Topics   Alcohol use: Yes    Alcohol/week: 3.0 standard drinks of alcohol    Types: 3 Glasses of wine per week    Comment: occ   Drug use: Never     Medication list has been reviewed and updated.  Current Meds  Medication Sig   amLODipine (NORVASC) 5 MG tablet Take 1 tablet (5 mg total) by mouth daily.   ascorbic acid (VITAMIN C) 500 MG tablet Take 500 mg by mouth daily.   Calcium Carb-Cholecalciferol (CALCIUM CARBONATE-VITAMIN D3) 600-400 MG-UNIT TABS Take 1 tablet by mouth daily.   furosemide (LASIX) 40 MG tablet Take 1 tablet (40 mg total) by mouth daily.   irbesartan (AVAPRO) 300 MG tablet TAKE 1 TABLET DAILY   metFORMIN (GLUCOPHAGE-XR) 500 MG 24 hr tablet Take 1 tablet (500 mg total) by mouth daily.   metoprolol (TOPROL-XL) 200 MG 24 hr tablet Take 1 tablet (200 mg total) by mouth daily.   Omega-3 Fatty Acids (FISH OIL) 1000 MG CAPS Take 2 capsules by mouth daily.   Semaglutide,0.25 or 0.5MG/DOS, (OZEMPIC, 0.25 OR 0.5 MG/DOSE,) 2 MG/1.5ML SOPN Inject 0.5 mg into the skin once a week.   simvastatin (ZOCOR) 20 MG tablet Take 1 tablet (20 mg total) by mouth daily.   Spacer/Aero-Holding Chambers (AEROCHAMBER MV) inhaler Use as instructed   traZODone (DESYREL) 50 MG tablet Take 50 mg by mouth at bedtime.       01/22/2022    9:03 AM 09/24/2021    8:09 AM 06/24/2021    2:08 PM  GAD 7 : Generalized Anxiety Score  Nervous, Anxious, on Edge 0 0 0  Control/stop worrying 0 0 0  Worry too much - different things 0 0 0  Trouble relaxing 0 0 0  Restless 0 0 0  Easily annoyed or irritable 0 0 0  Afraid - awful might happen 0 0 0  Total GAD 7 Score 0 0 0  Anxiety Difficulty Not difficult at all Not difficult at all Not difficult at all       01/22/2022    9:03 AM  Depression screen PHQ 2/9  Decreased Interest 0  Down, Depressed, Hopeless 0  PHQ - 2 Score 0  Altered sleeping 1  Tired,  decreased energy 1  Change in appetite 0  Feeling bad or failure about yourself  0  Trouble concentrating 0  Moving slowly or fidgety/restless 0  Suicidal thoughts 0  PHQ-9 Score 2  Difficult doing work/chores Not difficult at all    BP Readings from Last 3 Encounters:  01/22/22 128/84  10/22/21 (!) 176/104  09/24/21 120/80    Physical Exam Vitals and nursing note reviewed. Exam conducted with a chaperone present.  Constitutional:      General: She is not in acute distress.    Appearance: She is not diaphoretic.  HENT:     Head: Normocephalic and atraumatic.     Right Ear: Tympanic membrane, ear canal and external ear normal.     Left Ear: Tympanic membrane, ear canal and  external ear normal.     Nose: Nose normal. No congestion or rhinorrhea.  Eyes:     General: Lids are normal. Lids are everted, no foreign bodies appreciated. Vision grossly intact.        Right eye: No discharge.        Left eye: No discharge.     Conjunctiva/sclera:     Right eye: Right conjunctiva is injected. No chemosis, exudate or hemorrhage.    Left eye: Left conjunctiva is injected. No chemosis, exudate or hemorrhage.    Pupils: Pupils are equal, round, and reactive to light.  Neck:     Thyroid: No thyromegaly.     Vascular: No JVD.  Cardiovascular:     Rate and Rhythm: Normal rate and regular rhythm.     Heart sounds: Normal heart sounds. No murmur heard.    No friction rub. No gallop.  Pulmonary:     Effort: Pulmonary effort is normal.     Breath sounds: Normal breath sounds. No wheezing, rhonchi or rales.  Chest:     Chest wall: No tenderness.  Abdominal:     General: Bowel sounds are normal.     Palpations: Abdomen is soft. There is no mass.     Tenderness: There is no abdominal tenderness. There is no guarding.  Musculoskeletal:        General: Normal range of motion.     Cervical back: Normal range of motion and neck supple.  Lymphadenopathy:     Cervical: No cervical adenopathy.   Skin:    General: Skin is warm and dry.     Findings: No bruising or erythema.  Neurological:     Mental Status: She is alert.     Deep Tendon Reflexes: Reflexes are normal and symmetric.     Wt Readings from Last 3 Encounters:  01/22/22 218 lb (98.9 kg)  10/22/21 219 lb (99.3 kg)  09/24/21 215 lb (97.5 kg)    BP 128/84   Pulse 68   Ht _0  (1.676 m)   Wt 218 lb (98.9 kg)   BMI 35.19 kg/m   Assessment and Plan:  1. Hypertension associated with type 2 diabetes mellitus (HCC) Chronic.  Controlled.  Stable.  128/84 continue Avapro 300 mg once a day, amlodipine 5 mg once a day, Lasix 40 mg once a day, and metoprolol XL 200 mg once a day. - metoprolol (TOPROL-XL) 200 MG 24 hr tablet; Take 1 tablet (200 mg total) by mouth daily.  Dispense: 90 tablet; Refill: 1 - furosemide (LASIX) 40 MG tablet; Take 1 tablet (40 mg total) by mouth daily.  Dispense: 90 tablet; Refill: 1 - amLODipine (NORVASC) 5 MG tablet; Take 1 tablet (5 mg total) by mouth daily.  Dispense: 90 tablet; Refill: 1 - irbesartan (AVAPRO) 300 MG tablet; Take 1 tablet (300 mg total) by mouth daily.  Dispense: 90 tablet; Refill: 1  2. Controlled type 2 diabetes mellitus without complication, without long-term current use of insulin (HCC) Chronic.  Controlled.  Stable.  Continue metformin XR 500 mg once a day.  We also will continue Ozempic 0.5 mg once a week.  We will check A1c for current status of the control - metFORMIN (GLUCOPHAGE-XR) 500 MG 24 hr tablet; Take 1 tablet (500 mg total) by mouth daily.  Dispense: 90 tablet; Refill: 1 - Semaglutide,0.25 or 0.5MG/DOS, (OZEMPIC, 0.25 OR 0.5 MG/DOSE,) 2 MG/1.5ML SOPN; Inject 0.5 mg into the skin once a week.  Dispense: 4.5 mL; Refill: 1 - HgB  A1c  3. Mild intermittent reactive airway disease without complication Patient is unable to get her albuterol in the past.  We will try again and that she is got some increase in  expiratory to inspiratory ratio - albuterol (VENTOLIN  HFA) 108 (90 Base) MCG/ACT inhaler; Inhale 2 puffs into the lungs every 4 (four) hours as needed.  Dispense: 18 g; Refill: 0  4. Hyperlipidemia associated with type 2 diabetes mellitus (HCC) Chronic.  Controlled.  Stable.  Continue simvastatin 20 mg once a day. - simvastatin (ZOCOR) 20 MG tablet; Take 1 tablet (20 mg total) by mouth daily.  Dispense: 90 tablet; Refill: 1 6.  Allergic conjunctivitis Patient has some irritation and some matting of her left eye.  Evaluation of the eye notes no foreign body and eyelid was raised and no foreign body was noted in this area as well there is bilateral injection of both conjunctiva which is consistent with allergic conjunctivitis and we will treat with Patanol 0.1% ophthalmic solution 1 drop both eyes 2 times a day. - olopatadine (PATANOL) 0.1 % ophthalmic solution; Place 1 drop into both eyes 2 (two) times daily.  Dispense: 5 mL; Refill: 12

## 2022-01-22 NOTE — Telephone Encounter (Signed)
Requested medications are due for refill today.  yes  Requested medications are on the active medications list.  yes  Last refill. 01/22/2022 5 mL 12 refills  Future visit scheduled.   yes  Notes to clinic.  Note from pharmacy - Pharmacy comment: OLOPATADINE 0.1% RX SOL is mfr backordered. Please provide a new Rx below for an alternate medication.  OLOPATADINE 0.1% RX SOL is mfr backordered. Please provide a new Rx below for an alternate medication.    Requested Prescriptions  Pending Prescriptions Disp Refills   olopatadine (PATANOL) 0.1 % ophthalmic solution [Pharmacy Med Name: OLOPATADINE SOL 0.1% RX]  12    Sig: PLEASE UPDATE FOR ANY MEDICATION CHANGE     Ophthalmology:  Antiallergy Passed - 01/22/2022  2:56 PM      Passed - Valid encounter within last 12 months    Recent Outpatient Visits           Today Hypertension associated with type 2 diabetes mellitus ([place])   [facility] Juline Patch, MD   4 months ago Hypertension associated with type 2 diabetes mellitus ([place])   [facility] Juline Patch, MD   7 months ago Establishing care with new doctor, encounter for   McKenzie, MD       Future Appointments             In 1 week Juline Patch, MD [facility], [place]   In 6 months Juline Patch, MD [facility], [facility]

## 2022-01-23 ENCOUNTER — Telehealth: Payer: Self-pay

## 2022-01-23 LAB — HEMOGLOBIN A1C
Est. average glucose Bld gHb Est-mCnc: 128 mg/dL
Hgb A1c MFr Bld: 6.1 % — ABNORMAL HIGH (ref 4.8–5.6)

## 2022-01-29 ENCOUNTER — Ambulatory Visit (INDEPENDENT_AMBULATORY_CARE_PROVIDER_SITE_OTHER): Payer: Managed Care, Other (non HMO) | Admitting: Family Medicine

## 2022-01-29 ENCOUNTER — Ambulatory Visit
Admission: RE | Admit: 2022-01-29 | Discharge: 2022-01-29 | Disposition: A | Discharge status: Home / Self Care | Payer: Managed Care, Other (non HMO) | Source: Ambulatory Visit | Attending: Family Medicine | Admitting: Family Medicine

## 2022-01-29 ENCOUNTER — Ambulatory Visit
Admission: RE | Admit: 2022-01-29 | Discharge: 2022-01-29 | Disposition: A | Discharge status: Home / Self Care | Payer: Managed Care, Other (non HMO) | Attending: Family Medicine | Admitting: Family Medicine

## 2022-01-29 ENCOUNTER — Encounter: Payer: Self-pay | Admitting: Family Medicine

## 2022-01-29 VITALS — BP 122/70 | HR 76 | Ht 66.0 in | Wt 217.0 lb

## 2022-01-29 DIAGNOSIS — J019 Acute sinusitis, unspecified: Secondary | ICD-10-CM | POA: Diagnosis not present

## 2022-01-29 DIAGNOSIS — H1013 Acute atopic conjunctivitis, bilateral: Secondary | ICD-10-CM | POA: Diagnosis not present

## 2022-01-29 DIAGNOSIS — M5416 Radiculopathy, lumbar region: Secondary | ICD-10-CM | POA: Diagnosis present

## 2022-01-29 MED ORDER — AMOXICILLIN 500 MG PO CAPS: 500.0000 mg | ORAL_CAPSULE | Freq: Three times a day (TID) | ORAL | 0 refills | Status: AC

## 2022-01-29 MED ORDER — MELOXICAM 7.5 MG PO TABS: 7.5000 mg | ORAL_TABLET | Freq: Every day | ORAL | 0 refills | Status: DC

## 2022-01-29 MED ORDER — MONTELUKAST SODIUM 10 MG PO TABS: 10.0000 mg | ORAL_TABLET | Freq: Every day | ORAL | 3 refills | Status: DC

## 2022-01-29 NOTE — Progress Notes (Signed)
Date:  01/29/2022   Name:  Barbara Cortez   DOB:  1956-07-15   MRN:  [id number]   Chief Complaint: Back Pain (Across lower back radiating down the R) leg- feels like a "shocking sensation" when she gets up is worse. "Doesn't really happen during the day") and Allergic Rhinitis  (Eyes running- patanol not really helping. Having cough and congestion- green production)  Back Pain This is a chronic problem. The current episode started more than 1 year ago. The problem occurs constantly. The problem has been waxing and waning since onset. The pain is present in the lumbar spine. The pain radiates to the right thigh. The pain is at a severity of 6/10. The pain is moderate. The symptoms are aggravated by twisting and bending (rolling over in bed). Associated symptoms include paresthesias and weakness. Pertinent negatives include no bladder incontinence, bowel incontinence, chest pain, numbness, paresis or perianal numbness. She has tried NSAIDs for the symptoms. The treatment provided mild relief.  Sinusitis The current episode started in the past 7 days. The problem has been gradually worsening since onset. There has been no fever. The pain is mild. Associated symptoms include congestion, coughing and sinus pressure. Pertinent negatives include no ear pain, shortness of breath, sneezing or sore throat. Treatments tried: mucinex. The treatment provided mild relief.    Lab Results  Component Value Date   NA 143 06/24/2021   K 4.3 06/24/2021   CO2 28 06/24/2021   GLUCOSE 97 06/24/2021   BUN 11 06/24/2021   CREATININE 0.76 06/24/2021   CALCIUM 10.2 06/24/2021   EGFR 87 06/24/2021   Lab Results  Component Value Date   CHOL 208 (H) 06/24/2021   HDL 60 06/24/2021   LDLCALC 125 (H) 06/24/2021   TRIG 132 06/24/2021   Lab Results  Component Value Date   TSH 1.650 06/24/2021   Lab Results  Component Value Date   HGBA1C 6.1 (H) 01/22/2022   No results found for: "WBC", "HGB", "HCT",  "MCV", "PLT" No results found for: "ALT", "AST", "GGT", "ALKPHOS", "BILITOT" No results found for: "25OHVITD2", "25OHVITD3", "VD25OH"   Review of Systems  HENT:  Positive for congestion, postnasal drip and sinus pressure. Negative for ear pain, sneezing and sore throat.   Respiratory:  Positive for cough. Negative for shortness of breath.   Cardiovascular:  Negative for chest pain and leg swelling.  Gastrointestinal:  Negative for bowel incontinence.  Genitourinary:  Negative for bladder incontinence.  Musculoskeletal:  Positive for back pain.  Neurological:  Positive for weakness and paresthesias. Negative for numbness.    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   Lymphedema of both lower extremities 10/19/2018   Body mass index (BMI) of 35.0 to 35.9 with comorbidity 07/14/2018   Chronic bilateral low back pain without sciatica 07/14/2018   High risk medication use 06/19/2015   Tubular adenoma of colon 01/29/2015   Diabetes mellitus type II, controlled ([place]) 06/05/2014   Chronic insomnia 02/05/2014   Hyperlipidemia associated with type 2 diabetes mellitus ([place]) 02/05/2014   Hypertension associated with type 2 diabetes mellitus ([place]) 02/05/2014   Plantar fasciitis 02/05/2014    Allergies  Allergen Reactions   Ace Inhibitors Cough   Hydrochlorothiazide Itching    Photosensitivity    Past Surgical History:  Procedure Laterality Date   AUGMENTATION MAMMAPLASTY Bilateral 2000   COLONOSCOPY WITH PROPOFOL N/A 01/29/2015   Procedure: COLONOSCOPY WITH PROPOFOL;  Surgeon: Lollie Sails, MD;  Location: [facility] ENDOSCOPY;  Service: Endoscopy;  Laterality: N/A;   Hysteroscopy wtih enodmetrial ablation     Saline Breast Implants     SEPTOPLASTY      Social History   Tobacco Use   Smoking status: Some Days    Packs/day: 0.25    Years: 30.00    Total pack years: 7.50    Types: Cigarettes   Smokeless tobacco: Never  Vaping Use   Vaping  Use: Never used  Substance Use Topics   Alcohol use: Yes    Alcohol/week: 3.0 standard drinks of alcohol    Types: 3 Glasses of wine per week    Comment: occ   Drug use: Never     Medication list has been reviewed and updated.  Current Meds  Medication Sig   albuterol (VENTOLIN HFA) 108 (90 Base) MCG/ACT inhaler Inhale 2 puffs into the lungs every 4 (four) hours as needed.   amLODipine (NORVASC) 5 MG tablet Take 1 tablet (5 mg total) by mouth daily.   ascorbic acid (VITAMIN C) 500 MG tablet Take 500 mg by mouth daily.   Calcium Carb-Cholecalciferol (CALCIUM CARBONATE-VITAMIN D3) 600-400 MG-UNIT TABS Take 1 tablet by mouth daily.   furosemide (LASIX) 40 MG tablet Take 1 tablet (40 mg total) by mouth daily.   irbesartan (AVAPRO) 300 MG tablet Take 1 tablet (300 mg total) by mouth daily.   metFORMIN (GLUCOPHAGE-XR) 500 MG 24 hr tablet Take 1 tablet (500 mg total) by mouth daily.   metoprolol (TOPROL-XL) 200 MG 24 hr tablet Take 1 tablet (200 mg total) by mouth daily.   olopatadine (PATANOL) 0.1 % ophthalmic solution Place 1 drop into both eyes 2 (two) times daily.   Omega-3 Fatty Acids (FISH OIL) 1000 MG CAPS Take 2 capsules by mouth daily.   Semaglutide,0.25 or 0.5MG/DOS, (OZEMPIC, 0.25 OR 0.5 MG/DOSE,) 2 MG/1.5ML SOPN Inject 0.5 mg into the skin once a week.   simvastatin (ZOCOR) 20 MG tablet Take 1 tablet (20 mg total) by mouth daily.   Spacer/Aero-Holding Chambers (AEROCHAMBER MV) inhaler Use as instructed   traZODone (DESYREL) 50 MG tablet Take 50 mg by mouth at bedtime.       01/29/2022   10:37 AM 01/22/2022    9:03 AM 09/24/2021    8:09 AM 06/24/2021    2:08 PM  GAD 7 : Generalized Anxiety Score  Nervous, Anxious, on Edge 0 0 0 0  Control/stop worrying 0 0 0 0  Worry too much - different things 0 0 0 0  Trouble relaxing 0 0 0 0  Restless 0 0 0 0  Easily annoyed or irritable 0 0 0 0  Afraid - awful might happen 0 0 0 0  Total GAD 7 Score 0 0 0 0  Anxiety Difficulty Not  difficult at all Not difficult at all Not difficult at all Not difficult at all       01/29/2022   10:37 AM 01/22/2022    9:03 AM 09/24/2021    8:08 AM  Depression screen PHQ 2/9  Decreased Interest 0 0 0  Down, Depressed, Hopeless 0 0 0  PHQ - 2 Score 0 0 0  Altered sleeping 1 1 0  Tired, decreased energy 1 1 0  Change in appetite 0 0 0  Feeling bad or failure about yourself  0 0 0  Trouble concentrating 0 0 0  Moving slowly or fidgety/restless 0 0 0  Suicidal thoughts 0 0 0  PHQ-9 Score 2 2 0  Difficult doing work/chores Not difficult at all Not difficult at all Not difficult at all    BP Readings from Last 3 Encounters:  01/29/22 122/70  01/22/22 128/84  10/22/21 (!) 176/104    Physical Exam  Wt Readings from Last 3 Encounters:  01/29/22 217 lb (98.4 kg)  01/22/22 218 lb (98.9 kg)  10/22/21 219 lb (99.3 kg)    BP 122/70   Pulse 76   Ht 5' 6"  (1.676 m)   Wt 217 lb (98.4 kg)   BMI 35.02 kg/m   Assessment and Plan:  1. Allergic conjunctivitis of both eyes New onset.  Persistent.  Patient is still having an irritation of the eyes despite using Patanol.  We will add Singulair 10 mg once a day and I have suggested that patient consult her optometrist or ophthalmologist for possibility of tear duct occlusion. - montelukast (SINGULAIR) 10 MG tablet; Take 1 tablet (10 mg total) by mouth at bedtime.  Dispense: 30 tablet; Refill: 3  2. Acute sinusitis, recurrence not specified, unspecified location New onset.  Episodic.  Seasonal.  Patient has tenderness over the maxillary sinuses bilateral.  And we will treat with amoxicillin 500 mg 3 times a day for 10 days. - amoxicillin (AMOXIL) 500 MG capsule; Take 1 capsule (500 mg total) by mouth 3 (three) times daily for 10 days.  Dispense: 30 capsule; Refill: 0  3. Lumbar radicular pain Chronic.  Persistent.  Relatively stable and that pain is controllable but is becoming more red and issue particularly with activity.  Paraspinal  spasm is noted which may reflect some radiculopathy.  Neurologic exam is normal we will get an LS spine to assess level of degeneration and we will initiate meloxicam 7.5 mg once a day.  And we will likely refer to sports medicine for evaluation. - DG Lumbar Spine Complete - meloxicam (MOBIC) 7.5 MG tablet; Take 1 tablet (7.5 mg total) by mouth daily.  Dispense: 30 tablet; Refill: 0

## 2022-02-06 ENCOUNTER — Encounter: Payer: Self-pay | Admitting: Family Medicine

## 2022-02-06 ENCOUNTER — Ambulatory Visit (INDEPENDENT_AMBULATORY_CARE_PROVIDER_SITE_OTHER): Payer: Managed Care, Other (non HMO) | Admitting: Family Medicine

## 2022-02-06 VITALS — BP 138/84 | HR 88 | Ht 66.0 in | Wt 218.0 lb

## 2022-02-06 DIAGNOSIS — M4727 Other spondylosis with radiculopathy, lumbosacral region: Secondary | ICD-10-CM

## 2022-02-06 MED ORDER — CELECOXIB 100 MG PO CAPS: 100.0000 mg | ORAL_CAPSULE | Freq: Two times a day (BID) | ORAL | 0 refills | Status: DC

## 2022-02-06 MED ORDER — CYCLOBENZAPRINE HCL 10 MG PO TABS: 10.0000 mg | ORAL_TABLET | Freq: Every evening | ORAL | 0 refills | Status: DC | PRN

## 2022-02-06 MED ORDER — GABAPENTIN 100 MG PO CAPS: 100.0000 mg | ORAL_CAPSULE | Freq: Every day | ORAL | 0 refills | Status: DC

## 2022-02-06 NOTE — Assessment & Plan Note (Signed)
Patient presents for greater than 10 years of lower back pain, right greater than left with more recent (over the past few weeks) right radiating symptoms into the right thigh.  Pain worse with changes in positioning, arising from a seated/supine position, and motion after period of immobility.  Denies any bowel/bladder dysfunction, no overt weakness, limited response from meloxicam 7.5 mg.  Has performed physical therapy roughly 1 year prior with limited response.  Examination reveals positive [place]y Sox on the right, negative straight leg raise bilaterally, diminished right hip range of motion were compared to the contralateral, negative FADIR, equivocal piriformis stretching on right, lumbosacral spasm right greater than left.  Given her x-ray findings, chronicity of symptoms, and examination, plan for scheduled Celebrex, gabapentin, as needed cyclobenzaprine, home-based rehab, and close follow-up.  Low threshold for advanced imaging and referral to spine group given the chronicity and extent of findings.

## 2022-02-06 NOTE — Progress Notes (Signed)
Primary Care / Sports Medicine Office Visit  Patient Information:  Patient ID: Barbara Cortez, female DOB: April 03, 1956 Age: 66 y.o. MRN: [id number]   Barbara Cortez is a pleasant 66 y.o. female presenting with the following:  Chief Complaint  Patient presents with   Back Pain    Lower back pain for years, all across. No known injury, does feel like the meloxicam is working a little. No pain with walking, just form sitting or laying down, in mornings gets a shooting nerve jolt in legs.     Vitals:   02/06/22 0939  BP: 138/84  Pulse: 88  SpO2: 98%   Vitals:   02/06/22 0939  Weight: 218 lb (98.9 kg)  Height: '5\' 6"'$  (1.676 m)   Body mass index is 35.19 kg/m.  DG Lumbar Spine Complete  Result Date: 01/30/2022 CLINICAL DATA:  Back pain, radicular pain right lower extremity EXAM: LUMBAR SPINE - COMPLETE 4+ VIEW COMPARISON:  None Available. FINDINGS: There is mild levoscoliosis. No recent fracture is seen. Degenerative changes are noted with disc space narrowing, bony spurs and facet hypertrophy from L2-S1 levels. IMPRESSION: No recent fracture is seen in the lumbar spine.  Lumbar spondylosis. Electronically Signed   By: Elmer Picker M.D.   On: 01/30/2022 14:46     Independent interpretation of notes and tests performed by another provider:   Independent interpretation of lumbar spine x-rays dated 01/29/2022 reveals subtle scoliotic curvature on AP view, prominent right inferior lumbar facet hypertrophy L4-S1, subtle anterolisthesis of L4 on L5, grade 1, no acute osseous processes noted  Procedures performed:   None  Pertinent History, Exam, Impression, and Recommendations:   Problem List Items Addressed This Visit       Nervous and Auditory   Lumbosacral spondylosis with radiculopathy - Primary    Patient presents for greater than 10 years of lower back pain, right greater than left with more recent (over the past few weeks) right radiating symptoms into the  right thigh.  Pain worse with changes in positioning, arising from a seated/supine position, and motion after period of immobility.  Denies any bowel/bladder dysfunction, no overt weakness, limited response from meloxicam 7.5 mg.  Has performed physical therapy roughly 1 year prior with limited response.  Examination reveals positive Corky Sox on the right, negative straight leg raise bilaterally, diminished right hip range of motion were compared to the contralateral, negative FADIR, equivocal piriformis stretching on right, lumbosacral spasm right greater than left.  Given her x-ray findings, chronicity of symptoms, and examination, plan for scheduled Celebrex, gabapentin, as needed cyclobenzaprine, home-based rehab, and close follow-up.  Low threshold for advanced imaging and referral to spine group given the chronicity and extent of findings.      Relevant Medications   celecoxib (CELEBREX) 100 MG capsule   gabapentin (NEURONTIN) 100 MG capsule   cyclobenzaprine (FLEXERIL) 10 MG tablet     Orders & Medications Meds ordered this encounter  Medications   celecoxib (CELEBREX) 100 MG capsule    Sig: Take 1 capsule (100 mg total) by mouth 2 (two) times daily.    Dispense:  60 capsule    Refill:  0   gabapentin (NEURONTIN) 100 MG capsule    Sig: Take 1 capsule (100 mg total) by mouth at bedtime.    Dispense:  30 capsule    Refill:  0   cyclobenzaprine (FLEXERIL) 10 MG tablet    Sig: Take 1 tablet (10 mg total) by mouth at bedtime as  needed for muscle spasms.    Dispense:  30 tablet    Refill:  0   No orders of the defined types were placed in this encounter.    Return in about 4 weeks (around 03/06/2022).     Montel Culver, MD   Primary Care Sports Medicine [place]

## 2022-02-06 NOTE — Patient Instructions (Signed)
-   Stop meloxicam, start Celebrex twice daily with food - Start gabapentin nightly - Dose cyclobenzaprine nightly on as-needed basis for muscle tightness pain - Start home exercises with information provided and water exercises - Return for follow-up in 4 weeks, contact for questions between now and then

## 2022-02-25 ENCOUNTER — Other Ambulatory Visit: Payer: Self-pay | Admitting: Family Medicine

## 2022-02-25 DIAGNOSIS — M5416 Radiculopathy, lumbar region: Secondary | ICD-10-CM

## 2022-03-05 ENCOUNTER — Other Ambulatory Visit: Payer: Self-pay | Admitting: Family Medicine

## 2022-03-05 DIAGNOSIS — M4727 Other spondylosis with radiculopathy, lumbosacral region: Secondary | ICD-10-CM

## 2022-03-05 NOTE — Telephone Encounter (Signed)
Not been on medication (celebrex) for 8 weeks.  No labs required.  Requested Prescriptions  Pending Prescriptions Disp Refills  . gabapentin (NEURONTIN) 100 MG capsule [Pharmacy Med Name: GABAPENTIN 100MG CAPSULES] 30 capsule 0    Sig: TAKE 1 CAPSULE(100 MG) BY MOUTH AT BEDTIME     Neurology: Anticonvulsants - gabapentin Passed - 03/05/2022  3:33 AM      Passed - Cr in normal range and within 360 days    Creatinine, Ser  Date Value Ref Range Status  06/24/2021 0.76 0.57 - 1.00 mg/dL Final         Passed - Completed PHQ-2 or PHQ-9 in the last 360 days      Passed - Valid encounter within last 12 months    Recent Outpatient Visits          3 weeks ago Lumbosacral spondylosis with radiculopathy   [facility] Montel Culver, MD   1 month ago Allergic conjunctivitis of both eyes   [facility] Juline Patch, MD   1 month ago Hypertension associated with type 2 diabetes mellitus ([place])   [facility] Juline Patch, MD   5 months ago Hypertension associated with type 2 diabetes mellitus ([place])   [facility] Juline Patch, MD   8 months ago Establishing care with new doctor, encounter for   [place], MD      Future Appointments            Tomorrow Montel Culver, MD [facility], [place]   In 4 months Juline Patch, MD [facility], [place]           . celecoxib (CELEBREX) 100 MG capsule [Pharmacy Med Name: CELECOXIB 100MG CAPSULES] 60 capsule 0    Sig: TAKE 1 CAPSULE(100 MG) BY MOUTH TWICE DAILY     Analgesics:  COX2 Inhibitors Failed - 03/05/2022  3:33 AM      Failed - Manual Review: Labs are only required if the patient has taken medication for more than 8 weeks.      Failed - HGB in normal range and within 360 days    No results found for: "HGB", "HGBKUC", "HGBPOCKUC", "HGBOTHER", "TOTHGB", "HGBPLASMA"       Failed - HCT in normal range and within 360 days    No results found for:  "HCT", "HCTKUC", "SRHCT"       Failed - AST in normal range and within 360 days    No results found for: "POCAST", "AST"       Failed - ALT in normal range and within 360 days    No results found for: "ALT", "LABALT", "POCALT"       Passed - Cr in normal range and within 360 days    Creatinine, Ser  Date Value Ref Range Status  06/24/2021 0.76 0.57 - 1.00 mg/dL Final         Passed - eGFR is 30 or above and within 360 days    eGFR  Date Value Ref Range Status  06/24/2021 87 >59 mL/min/1.73 Final         Passed - Patient is not pregnant      Passed - Valid encounter within last 12 months    Recent Outpatient Visits          3 weeks ago Lumbosacral spondylosis with radiculopathy   [facility] Montel Culver, MD   1 month ago Allergic conjunctivitis of  both eyes   [facility] Juline Patch, MD   1 month ago Hypertension associated with type 2 diabetes mellitus ([place])   [facility] Juline Patch, MD   5 months ago Hypertension associated with type 2 diabetes mellitus [facility])   [facility] Juline Patch, MD   8 months ago Establishing care with new doctor, encounter for   [facility], [place]      Future Appointments            Tomorrow Montel Culver, MD [facility], [place]   In 4 months Juline Patch, MD [facility], [facility]

## 2022-03-06 ENCOUNTER — Encounter: Payer: Self-pay | Admitting: Family Medicine

## 2022-03-06 ENCOUNTER — Ambulatory Visit (INDEPENDENT_AMBULATORY_CARE_PROVIDER_SITE_OTHER): Payer: Managed Care, Other (non HMO) | Admitting: Family Medicine

## 2022-03-06 DIAGNOSIS — M4727 Other spondylosis with radiculopathy, lumbosacral region: Secondary | ICD-10-CM | POA: Diagnosis not present

## 2022-03-06 MED ORDER — GABAPENTIN 300 MG PO CAPS: 300.0000 mg | ORAL_CAPSULE | Freq: Every day | ORAL | 0 refills | Status: DC

## 2022-03-06 MED ORDER — CYCLOBENZAPRINE HCL 10 MG PO TABS: 10.0000 mg | ORAL_TABLET | Freq: Every evening | ORAL | 0 refills | Status: DC | PRN

## 2022-03-06 MED ORDER — CELECOXIB 100 MG PO CAPS: ORAL_CAPSULE | ORAL | 2 refills | Status: DC

## 2022-03-06 NOTE — Assessment & Plan Note (Addendum)
Patient returns for follow-up to lumbosacral spondylosis with right-sided radiculopathy, at last visit she was started on Celebrex 100 mg twice daily, Flexeril at bedtime as needed, and gabapentin 100 mg nightly.  On this regimen she has reported roughly 20-30% improvement.  Has been attending twice weekly water exercises but has not been able to perform home exercises as regularly as she would like.  At this stage, given her limited progress, will escalate pharmacotherapy to 2 mg Celebrex every morning, 100 mg Celebrex every afternoon, ensure Flexeril is dosed if symptomatic, and titrate gabapentin 300 mg nightly.  Additionally, MRI lumbar spine ordered, new referral to PT placed, and referral to spine group has been placed as well.

## 2022-03-06 NOTE — Progress Notes (Signed)
     Primary Care / Sports Medicine Office Visit  Patient Information:  Patient ID: Barbara Cortez, female DOB: 1956-07-27 Age: 66 y.o. MRN: [id number]   Barbara Cortez is a pleasant 66 y.o. female presenting with the following:  Chief Complaint  Patient presents with   Lumbosacral spondylosis with radiculopathy    States it is still a little painful but not as bad.     Vitals:   03/06/22 0844  BP: (!) 138/90  Pulse: 88  SpO2: 96%   Vitals:   03/06/22 0844  Weight: 216 lb (98 kg)   Body mass index is 34.86 kg/m.  No results found.   Independent interpretation of notes and tests performed by another provider:   None  Procedures performed:   None  Pertinent History, Exam, Impression, and Recommendations:   Problem List Items Addressed This Visit       Nervous and Auditory   Lumbosacral spondylosis with radiculopathy    Patient returns for follow-up to lumbosacral spondylosis with right-sided radiculopathy, at last visit she was started on Celebrex 100 mg twice daily, Flexeril at bedtime as needed, and gabapentin 100 mg nightly.  On this regimen she has reported roughly 20-30% improvement.  Has been attending twice weekly water exercises but has not been able to perform home exercises as regularly as she would like.  At this stage, given her limited progress, will escalate pharmacotherapy to 2 mg Celebrex every morning, 100 mg Celebrex every afternoon, ensure Flexeril is dosed if symptomatic, and titrate gabapentin 300 mg nightly.  Additionally, MRI lumbar spine ordered, new referral to PT placed, and referral to spine group has been placed as well.      Relevant Medications   celecoxib (CELEBREX) 100 MG capsule   gabapentin (NEURONTIN) 300 MG capsule   cyclobenzaprine (FLEXERIL) 10 MG tablet   Other Relevant Orders   Ambulatory referral to Physical Therapy   Ambulatory referral to Pain Clinic   MR Lumbar Spine Wo Contrast     Orders & Medications Meds  ordered this encounter  Medications   celecoxib (CELEBREX) 100 MG capsule    Sig: Take 2 capsules every AM and 1 capsule every PM    Dispense:  90 capsule    Refill:  2   gabapentin (NEURONTIN) 300 MG capsule    Sig: Take 1 capsule (300 mg total) by mouth at bedtime.    Dispense:  90 capsule    Refill:  0   cyclobenzaprine (FLEXERIL) 10 MG tablet    Sig: Take 1 tablet (10 mg total) by mouth at bedtime as needed for muscle spasms.    Dispense:  90 tablet    Refill:  0   Orders Placed This Encounter  Procedures   MR Lumbar Spine Wo Contrast   Ambulatory referral to Physical Therapy   Ambulatory referral to Pain Clinic     Return in about 8 weeks (around 05/01/2022).     Montel Culver, MD   Primary Care Sports Medicine [place]

## 2022-03-06 NOTE — Patient Instructions (Addendum)
-   Increase Celebrex to 2 capsules every morning and continue 1 capsule every evening - Dose gabapentin 300 mg capsule nightly - Continue cyclobenzaprine nightly on as-needed basis - Restart physical therapy, perform home exercises regularly - Referral coordinator will contact you to schedule visit with spine group and MRI - Return for follow-up here in 8 weeks

## 2022-03-09 NOTE — Therapy (Signed)
OUTPATIENT PHYSICAL THERAPY THORACOLUMBAR EVALUATION   Patient Name: Barbara Cortez MRN: [id number] DOB:September 28, 1955, 66 y.o., female Today's Date: 03/13/2022   PT End of Session - 03/13/22 0801     Visit Number 1    Number of Visits 17    Date for PT Re-Evaluation 05/08/22    Authorization Type eval: 03/13/22    PT Start Time 0802    PT Stop Time 0845    PT Time Calculation (min) 43 min    Activity Tolerance Patient tolerated treatment well    Behavior During Therapy WFL for tasks assessed/performed             Past Medical History:  Diagnosis Date   Anxiety    Broken arm, left, closed, initial encounter    Chronic insomnia    COPD (chronic obstructive pulmonary disease) ([place])    Diabetes mellitus without complication ([place])    Diverticulosis    Hypertension    Impaired glucose tolerance    Internal hemorrhoids    Past Surgical History:  Procedure Laterality Date   AUGMENTATION MAMMAPLASTY Bilateral 2000   COLONOSCOPY WITH PROPOFOL N/A 01/29/2015   Procedure: COLONOSCOPY WITH PROPOFOL;  Surgeon: Lollie Sails, MD;  Location[facility] ENDOSCOPY;  Service: Endoscopy;  Laterality: N/A;   Hysteroscopy wtih enodmetrial ablation     Saline Breast Implants     SEPTOPLASTY     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   Lymphedema of both lower extremities 10/19/2018   Body mass index (BMI) of 35.0 to 35.9 with comorbidity 07/14/2018   Lumbosacral spondylosis with radiculopathy 07/14/2018   High risk medication use 06/19/2015   Tubular adenoma of colon 01/29/2015   Diabetes mellitus type II, controlled ([place]) 06/05/2014   Chronic insomnia 02/05/2014   Hyperlipidemia associated with type 2 diabetes mellitus ([place]) 02/05/2014   Hypertension associated with type 2 diabetes mellitus ([place]) 02/05/2014   Plantar fasciitis 02/05/2014    PCP: Juline Patch, MD  REFERRING PROVIDER: Montel Culver, MD  REFERRING DIAGNOSIS: M47.27  (ICD-10-CM) - Lumbosacral spondylosis with radiculopathy   THERAPY DIAG: Other low back pain  RATIONALE FOR EVALUATION AND TREATMENT: Rehabilitation  ONSET DATE: 03/13/2012 (approximate)  FOLLOW UP APPT WITH PROVIDER: Yes, September 2023 with Dr. Zigmund Daniel   SUBJECTIVE:                                                                                                                                                                                         Chief Complaint: Low back pain   Pertinent History Insidiout onset of chronic back pain at least 10 years  ago with progressive worsening. "I can barely flip over in bed." She completed a course of physical therapy approximately 1 year ago at [facility] but did not notice significant improvement at that time. Pain worsens if she sits for a prolonged period of time and then tries to stand up (pain occurs when trying to stand not with extended sitting). Her pain improves with progressive activity  such as walking. Pt notices that first thing in the morning she gets pain radiating down anterior/anterolateral R thigh to the level of the knee but never any farther. MD ordered a lumbar MRI which is currently scheduled for next Friday 03/20/22. She has been increasing her Gabapentin as instructed with some AM somnolence noted and no improvement in pain. No history of significant knee or hip pain. She purchased a new mattress but has not noticed any change in her back. Recent lumbar radiographs showed mild levoscoliosis. No recent fracture is seen. Degenerative changes are noted with disc space narrowing, bony spurs and facet hypertrophy from L2-S1 levels.  Pain:  Pain Intensity: Present: 1/10, Best: 1/10, Worst: 6/10 Pain location: Bilateral low back, worse on the right side Pain Quality: sharp and constant Radiating: Yes  Numbness/Tingling: No Focal Weakness: No Aggravating factors: Extended sitting, first thing in the AM, bending (yard work),  rolling in bed, bending over in standing, extending standing Relieving factors: Water aerobics ([employer] for the last month 2x/wk), heating pad, has tried ice without change, Celebrex, no real benefit with Flexeril or Gabapentin,  24-hour pain behavior: Worse in the AM, improves as day progresses How long can you sit: no limitations How long can you stand: At least an hour History of prior back injury, pain, surgery, or therapy: Yes, history of PT at [facility] clinic. No prior history of injury or pain Falls: Has patient fallen in last 6 months? 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 Hobbies: water aerobics, working around the house inside and outside, currently Marine scientist   Precautions: None  [employer] Restrictions: No  Living Environment Lives with: lives alone Lives in: House/apartment, two story right side railing to second floor, 2 steps outside no rails Has following equipment at home: Grab bars in walk-in shower  Patient Goals: "I would like to be able to sleep more comfortably."   OBJECTIVE:   Patient Surveys  Modified Oswestry To be completed  FOTO 72, predicted worsening to 94   Cognition Patient is oriented to person, place, and time.  Recent memory is intact.  Remote memory is intact.  Attention span and concentration are intact.  Expressive speech is intact.  Patient's fund of knowledge is within normal limits for educational level.     Gross Musculoskeletal Assessment Tremor: None Bulk: Normal Tone: Normal No visible step-off along spinal column, no signs of scoliosis   GAIT: Full assessment deferred, no gross deficits noted entering/exiting clinic   Posture: Lumbar lordosis: Excessive lumbar lordosis noted; Iliac crest height: Equal bilaterally Lumbar lateral  shift: Negative   AROM  AROM (Normal range in degrees) AROM  03/13/2022  Lumbar   Flexion (65) Composite flexion WNL, segmental flexion is significant impaired during forward bend  Extension (30) WNL  Right lateral flexion (25) WNL  Left lateral flexion (25) 25% limitation with R low back pulling  Right rotation (30) WNL  Left rotation (30) WNL  Hip Right Left  Flexion (125) WNL* low back pain at end range WNL  Extension (15)    Abduction (40) WNL WNL  Adduction  WNL WNL  Internal Rotation (45) >45 >45  External Rotation (45) >45 >45      Knee    Flexion (135) WNL WNL  Extension (0) WNL WNL      Ankle    Dorsiflexion (20)    Plantarflexion (50)    Inversion (35)    Eversion (15    (* = pain; Blank rows = not tested)   LE MMT:  MMT (out of 5) Right 03/13/2022 Left 03/13/2022  Hip flexion 5 5  Hip extension 3+ 3+  Hip abduction 4+ 4+  Hip adduction 4+ 4  Hip internal rotation 5 5  Hip external rotation 5 5  Knee flexion 4+ 4+  Knee extension 5 5  Ankle dorsiflexion 5 5  Ankle plantarflexion    Ankle inversion    Ankle eversion    (* = pain; Blank rows = not tested)   Sensation Grossly intact to light touch bilateral LEs as determined by testing dermatomes L2-S2. Proprioception, and hot/cold testing deferred on this date.   Reflexes Deferred   Muscle Length Hamstrings: R: Negative L: Negative   Palpation  Location LEFT  RIGHT           Lumbar paraspinals 0 0  Quadratus Lumborum 0 0  Gluteus Maximus 0 0  Gluteus Medius 1 1  Deep hip external rotators 0 0  PSIS 0 1  Fortin's Area (SIJ) 0 1  Greater Trochanter 0 0  (Blank rows = not tested) Graded on 0-4 scale (0 = no pain, 1 = pain, 2 = pain with wincing/grimacing/flinching, 3 = pain with withdrawal, 4 = unwilling to allow palpation), (Blank rows = not tested)   Passive Accessory Intervertebral Motion Pt denies reproduction of back pain with CPA L1-L5 and UPA bilaterally L1-L5 with the  exception of reproduction of low back pain with R UPA at L5. Normal mobility throughout    SPECIAL TESTS Lumbar Radiculopathy and Discogenic: Centralization and Peripheralization (SN 92, -LR 0.12): Positive for relief at home with repeated extension Slump (SN 83, -LR 0.32): R: Negative L: Negative SLR (SN 92, -LR 0.29): R: Negative L:  Negative Crossed SLR (SP 90): R: Negative L: Negative  Facet Joint: Extension-Rotation (SN 100, -LR 0.0): R: Negative L: Negative  Lumbar Foraminal Stenosis: Lumbar quadrant (SN 70): R: Negative L: Negative  Hip: FABER (SN 81): R: Negative L: Negative FADIR (SN 94): R: Positive for low back pain L: Negative Hip scour (SN 50): R: Negative L: Negative  SIJ:  Thigh Thrust (SN 88, -LR 0.18) : R: Not examined L: Not examined  Piriformis Syndrome: FAIR Test (SN 88, SP 83): R: Not examined L: Not examined  Functional Tasks Deferred  Beighton scale: History of possible hypermobility in youth  LEFT  RIGHT           1. Passive dorsiflexion and hyperextension of the fifth MCP joint beyond 90  0 0   2. Passive apposition of the thumb to the flexor aspect of the forearm  0  0   3. Passive hyperextension of the elbow beyond 10  0  0   4. Passive hyperextension of the knee beyond '10  1 1  '$ 5. Active forward flexion of the trunk with the knees fully extended so that the palms of the hands rest flat on the floor  1   TOTAL         0/ 9     TODAY'S TREATMENT  None   PATIENT EDUCATION:  Education details: Chronic pain, plan of care Person educated: Patient Education method: Explanation and Handouts Education comprehension: verbalized understanding   HOME EXERCISE PROGRAM: Access Code: [email or web address] URL: [email or web address] Date: 03/13/2022 Prepared by: Roxana Hires  Patient Education - What Is Pain?   ASSESSMENT:  CLINICAL IMPRESSION: Patient is a 66 y.o. female who was seen today for physical therapy evaluation and treatment  for back pain. Objective impairments include decreased strength, obesity, and pain. These impairments are limiting patient from community activity, occupation, and yard work. Personal factors including Age, Fitness, Past/current experiences, Time since onset of injury/illness/exacerbation, and 3+ comorbidities: DM, obesity, and chronic insomnia  are also affecting patient's functional outcome. Patient will benefit from skilled PT to address above impairments and improve overall function.  REHAB POTENTIAL: Good  CLINICAL DECISION MAKING: Evolving/moderate complexity  EVALUATION COMPLEXITY: Moderate   GOALS: Goals reviewed with patient? No  SHORT TERM GOALS: Target date: 04/10/2022  Pt will be independent with HEP to improve strength and decrease back pain to improve pain-free function at home and work. Baseline:  Goal status: INITIAL   LONG TERM GOALS: Target date: 05/08/2022   1.  Pt will decrease worst back pain by at least 2 points on the NPRS in order to demonstrate clinically significant reduction in back pain. Baseline: 03/13/22: Worst 6/10 Goal status: INITIAL  2.  Pt will decrease mODI score by at least 13 points in order demonstrate clinically significant reduction in back pain/disability.       Baseline: 03/13/22: To be completed Goal status: INITIAL  3.  Pt will report at least 50% improvement in her back pain in order to return to full function at home, work, and with exercise with less pain Baseline:  Goal status: INITIAL   PLAN: PT FREQUENCY: 1-2x/week  PT DURATION: 8 weeks  PLANNED INTERVENTIONS: Therapeutic exercises, Therapeutic activity, Neuromuscular re-education, Balance training, Gait training, Patient/Family education, Joint manipulation, Joint mobilization, Canalith repositioning, Aquatic Therapy, Dry Needling, Cognitive remediation, Electrical stimulation, Spinal manipulation, Spinal mobilization, Cryotherapy, Moist heat, Traction, Ultrasound, Ionotophoresis  '4mg'$ /ml Dexamethasone, and Manual therapy  PLAN FOR NEXT SESSION: Have pt complete mODI, initiate exercises and manual techniques, issue HEP   Teondre Jarosz D Gianlucas Evenson PT, DPT, GCS  Hulon Ferron 03/13/2022, 1:32 PM

## 2022-03-13 ENCOUNTER — Ambulatory Visit: Payer: Managed Care, Other (non HMO) | Attending: Family Medicine

## 2022-03-13 DIAGNOSIS — M5459 Other low back pain: Secondary | ICD-10-CM | POA: Diagnosis present

## 2022-03-13 DIAGNOSIS — M4727 Other spondylosis with radiculopathy, lumbosacral region: Secondary | ICD-10-CM | POA: Diagnosis not present

## 2022-03-18 ENCOUNTER — Ambulatory Visit: Payer: Managed Care, Other (non HMO)

## 2022-03-18 NOTE — Therapy (Signed)
OUTPATIENT PHYSICAL THERAPY THORACOLUMBAR TREATMENT   Patient Name: Barbara Cortez MRN: [id number] DOB:08-10-1955, 66 y.o., female Today's Date: 03/20/2022   PT End of Session - 03/20/22 1107     Visit Number 2    Number of Visits 17    Date for PT Re-Evaluation 05/08/22    Authorization Type eval: 03/13/22    PT Start Time 0801    PT Stop Time 0847    PT Time Calculation (min) 46 min    Activity Tolerance Patient tolerated treatment well    Behavior During Therapy [facility] for tasks assessed/performed              Past Medical History:  Diagnosis Date   Anxiety    Broken arm, left, closed, initial encounter    Chronic insomnia    COPD (chronic obstructive pulmonary disease) ([place])    Diabetes mellitus without complication ([place])    Diverticulosis    Hypertension    Impaired glucose tolerance    Internal hemorrhoids    Past Surgical History:  Procedure Laterality Date   AUGMENTATION MAMMAPLASTY Bilateral 2000   COLONOSCOPY WITH PROPOFOL N/A 01/29/2015   Procedure: COLONOSCOPY WITH PROPOFOL;  Surgeon: Lollie Sails, MD;  Location: [facility] ENDOSCOPY;  Service: Endoscopy;  Laterality: N/A;   Hysteroscopy wtih enodmetrial ablation     Saline Breast Implants     SEPTOPLASTY     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   Lymphedema of both lower extremities 10/19/2018   Body mass index (BMI) of 35.0 to 35.9 with comorbidity 07/14/2018   Lumbosacral spondylosis with radiculopathy 07/14/2018   High risk medication use 06/19/2015   Tubular adenoma of colon 01/29/2015   Diabetes mellitus type II, controlled ([place]) 06/05/2014   Chronic insomnia 02/05/2014   Hyperlipidemia associated with type 2 diabetes mellitus ([place]) 02/05/2014   Hypertension associated with type 2 diabetes mellitus ([place]) 02/05/2014   Plantar fasciitis 02/05/2014    PCP: Juline Patch, MD  REFERRING PROVIDER: Montel Culver, MD  REFERRING DIAGNOSIS: M47.27  (ICD-10-CM) - Lumbosacral spondylosis with radiculopathy   THERAPY DIAG: Other low back pain  RATIONALE FOR EVALUATION AND TREATMENT: Rehabilitation  ONSET DATE: 03/13/2012 (approximate)  FOLLOW UP APPT WITH PROVIDER: Yes, September 2023 with Dr. Zigmund Daniel  FROM INITIAL EVALUATION  SUBJECTIVE:                                                                                                                                                                                         Chief Complaint: Low back pain   Pertinent History Insidiout onset of chronic back pain  at least 10 years ago with progressive worsening. "I can barely flip over in bed." She completed a course of physical therapy approximately 1 year ago at [facility] but did not notice significant improvement at that time. Pain worsens if she sits for a prolonged period of time and then tries to stand up (pain occurs when trying to stand not with extended sitting). Her pain improves with progressive activity  such as walking. Pt notices that first thing in the morning she gets pain radiating down anterior/anterolateral R thigh to the level of the knee but never any farther. MD ordered a lumbar MRI which is currently scheduled for next Friday 03/20/22. She has been increasing her Gabapentin as instructed with some AM somnolence noted and no improvement in pain. No history of significant knee or hip pain. She purchased a new mattress but has not noticed any change in her back. Recent lumbar radiographs showed mild levoscoliosis. No recent fracture is seen. Degenerative changes are noted with disc space narrowing, bony spurs and facet hypertrophy from L2-S1 levels.  Pain:  Pain Intensity: Present: 1/10, Best: 1/10, Worst: 6/10 Pain location: Bilateral low back, worse on the right side Pain Quality: sharp and constant Radiating: Yes  Numbness/Tingling: No Focal Weakness: No Aggravating factors: Extended sitting, first thing in the AM,  bending (yard work), rolling in bed, bending over in standing, extending standing Relieving factors: Water aerobics ([employer] for the last month 2x/wk), heating pad, has tried ice without change, Celebrex, no real benefit with Flexeril or Gabapentin,  24-hour pain behavior: Worse in the AM, improves as day progresses How long can you sit: no limitations How long can you stand: At least an hour History of prior back injury, pain, surgery, or therapy: Yes, history of PT at [facility] clinic. No prior history of injury or pain Falls: Has patient fallen in last 6 months? 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 Hobbies: water aerobics, working around the house inside and outside, currently Marine scientist   Precautions: None  [employer] Restrictions: No  Living Environment Lives with: lives alone Lives in: House/apartment, two story right side railing to second floor, 2 steps outside no rails Has following equipment at home: Grab bars in walk-in shower  Patient Goals: "I would like to be able to sleep more comfortably."   OBJECTIVE:   Patient Surveys  Modified Oswestry To be completed  FOTO 72, predicted worsening to 52   Cognition Patient is oriented to person, place, and time.  Recent memory is intact.  Remote memory is intact.  Attention span and concentration are intact.  Expressive speech is intact.  Patient's fund of knowledge is within normal limits for educational level.     Gross Musculoskeletal Assessment Tremor: None Bulk: Normal Tone: Normal No visible step-off along spinal column, no signs of scoliosis   GAIT: Full assessment deferred, no gross deficits noted entering/exiting clinic   Posture: Lumbar lordosis: Excessive lumbar lordosis noted; Iliac crest height: Equal  bilaterally Lumbar lateral shift: Negative   AROM  AROM (Normal range in degrees) AROM  03/20/2022  Lumbar   Flexion (65) Composite flexion WNL, segmental flexion is significant impaired during forward bend  Extension (30) WNL  Right lateral flexion (25) WNL  Left lateral flexion (25) 25% limitation with R low back pulling  Right rotation (30) WNL  Left  rotation (30) WNL      Hip Right Left  Flexion (125) WNL* low back pain at end range WNL  Extension (15)    Abduction (40) WNL WNL  Adduction  WNL WNL  Internal Rotation (45) >45 >45  External Rotation (45) >45 >45      Knee    Flexion (135) WNL WNL  Extension (0) WNL WNL      Ankle    Dorsiflexion (20)    Plantarflexion (50)    Inversion (35)    Eversion (15    (* = pain; Blank rows = not tested)   LE MMT:  MMT (out of 5) Right 03/20/2022 Left 03/20/2022  Hip flexion 5 5  Hip extension 3+ 3+  Hip abduction 4+ 4+  Hip adduction 4+ 4  Hip internal rotation 5 5  Hip external rotation 5 5  Knee flexion 4+ 4+  Knee extension 5 5  Ankle dorsiflexion 5 5  Ankle plantarflexion    Ankle inversion    Ankle eversion    (* = pain; Blank rows = not tested)   Sensation Grossly intact to light touch bilateral LEs as determined by testing dermatomes L2-S2. Proprioception, and hot/cold testing deferred on this date.   Reflexes Deferred   Muscle Length Hamstrings: R: Negative L: Negative   Palpation  Location LEFT  RIGHT           Lumbar paraspinals 0 0  Quadratus Lumborum 0 0  Gluteus Maximus 0 0  Gluteus Medius 1 1  Deep hip external rotators 0 0  PSIS 0 1  Fortin's Area (SIJ) 0 1  Greater Trochanter 0 0  (Blank rows = not tested) Graded on 0-4 scale (0 = no pain, 1 = pain, 2 = pain with wincing/grimacing/flinching, 3 = pain with withdrawal, 4 = unwilling to allow palpation), (Blank rows = not tested)   Passive Accessory Intervertebral Motion Pt denies reproduction of back pain with CPA L1-L5 and UPA  bilaterally L1-L5 with the exception of reproduction of low back pain with R UPA at L5. Normal mobility throughout    SPECIAL TESTS Lumbar Radiculopathy and Discogenic: Centralization and Peripheralization (SN 92, -LR 0.12): Positive for relief at home with repeated extension Slump (SN 83, -LR 0.32): R: Negative L: Negative SLR (SN 92, -LR 0.29): R: Negative L:  Negative Crossed SLR (SP 90): R: Negative L: Negative  Facet Joint: Extension-Rotation (SN 100, -LR 0.0): R: Negative L: Negative  Lumbar Foraminal Stenosis: Lumbar quadrant (SN 70): R: Negative L: Negative  Hip: FABER (SN 81): R: Negative L: Negative FADIR (SN 94): R: Positive for low back pain L: Negative Hip scour (SN 50): R: Negative L: Negative  SIJ:  Thigh Thrust (SN 88, -LR 0.18) : R: Not examined L: Not examined  Piriformis Syndrome: FAIR Test (SN 88, SP 83): R: Not examined L: Not examined  Functional Tasks Deferred  Beighton scale: History of possible hypermobility in youth  LEFT  RIGHT           1. Passive dorsiflexion and hyperextension of the fifth MCP joint beyond 90  0 0   2. Passive apposition of the thumb to the flexor aspect of the forearm  0  0   3. Passive hyperextension of the elbow beyond 10  0  0   4. Passive hyperextension of the knee beyond '10  1 1  '$ 5. Active forward flexion of the trunk with the knees fully extended so that the palms of the  hands rest flat on the floor   1   TOTAL         0/ 9     TODAY'S TREATMENT    SUBJECTIVE: Pt reports that she is doing well today. No changes since the initial evaluation. She denies any resting low back pain upon arrival and states that she didn't have any sciatica this morning. No specific questions or concerns currently.   PAIN: Denies   Ther-ex  NuStep L1 x 6 minutes with moist heat pack on lumbar spine for warm-up during interval history (3 minutes unbilled); mODI: 18%; Hooklying lumbar rotation rocking x 60s; Hooklying  anterior/posterior pelvic tilts 3s hold x 10 each; Hooklying marching with transverse abdominis contraction x 10 BLE; Hooklying hip fallouts with transverse abdominis contraction x 10 BLE; Hooklying clams with blue tband x 10 BLE; Hooklying adductor ball squeeze x 10 BLE; HEP provided and reviewed with patient;   Manual Therapy Single knee to chest stretch x 30s BLE; Hip FABER and FADIR stretches x 30s each BLE;    PATIENT EDUCATION:  Education details: Chronic pain, plan of care, HEP, Pt educated throughout session about proper posture and technique with exercises. Improved exercise technique, movement at target joints, use of target muscles after min to mod verbal, visual, tactile cues.  Person educated: Patient Education method: Theatre stage manager Education comprehension: verbalized understanding   HOME EXERCISE PROGRAM: Access Code: [email or web address] URL: [email or web address] Date: 03/20/2022 Prepared by: Roxana Hires  Exercises - Supine Pelvic Tilt  - 1 x daily - 7 x weekly - 2 sets - 10 reps - 3s hold - Hooklying Lumbar Rotation  - 1 x daily - 7 x weekly - 2 sets - 10 reps - 3s hold - Hooklying Clamshell with Resistance  - 1 x daily - 7 x weekly - 2 sets - 10 reps - 3s hold - Supine Hip Adduction Isometric with Ball  - 1 x daily - 7 x weekly - 2 sets - 10 reps - 3s hold  Patient Education - What Is Pain? - Treating Persistent Pain Without Medications: Exercise   ASSESSMENT:  CLINICAL IMPRESSION: Patient demonstrates excellent motivation during session today. She completed mODI and scored 18% indicating low perception of disability related to low back pain. Initiated exercises for pain-free AROM and light strengthening. HEP issued and pt encouraged to follow-up as scheduled. She will benefit from skilled PT to address above impairments and improve overall function.  REHAB POTENTIAL: Good  CLINICAL DECISION MAKING: Evolving/moderate  complexity  EVALUATION COMPLEXITY: Moderate   GOALS: Goals reviewed with patient? No  SHORT TERM GOALS: Target date: 04/10/2022  Pt will be independent with HEP to improve strength and decrease back pain to improve pain-free function at home and work. Baseline:  Goal status: INITIAL   LONG TERM GOALS: Target date: 05/08/2022   1.  Pt will decrease worst back pain by at least 2 points on the NPRS in order to demonstrate clinically significant reduction in back pain. Baseline: 03/13/22: Worst 6/10 Goal status: INITIAL  2.  Pt will decrease mODI score by at least 13 points in order demonstrate clinically significant reduction in back pain/disability.       Baseline: 03/13/22: To be completed; 03/20/22: 18% Goal status: INITIAL  3.  Pt will report at least 50% improvement in her back pain in order to return to full function at home, work, and with exercise with less pain Baseline:  Goal status: INITIAL   PLAN: PT FREQUENCY: 1-2x/week  PT  DURATION: 8 weeks  PLANNED INTERVENTIONS: Therapeutic exercises, Therapeutic activity, Neuromuscular re-education, Balance training, Gait training, Patient/Family education, Joint manipulation, Joint mobilization, Canalith repositioning, Aquatic Therapy, Dry Needling, Cognitive remediation, Electrical stimulation, Spinal manipulation, Spinal mobilization, Cryotherapy, Moist heat, Traction, Ultrasound, Ionotophoresis '4mg'$ /ml Dexamethasone, and Manual therapy  PLAN FOR NEXT SESSION: Continue exercises and manual techniques, review/modify HEP as necessary;   Lyndel Safe Tamario Heal PT, DPT, GCS  Johanna Stafford 03/20/2022, 11:10 AM

## 2022-03-20 ENCOUNTER — Ambulatory Visit: Payer: Managed Care, Other (non HMO)

## 2022-03-20 ENCOUNTER — Ambulatory Visit
Admission: RE | Admit: 2022-03-20 | Discharge: 2022-03-20 | Disposition: A | Discharge status: Home / Self Care | Payer: Managed Care, Other (non HMO) | Source: Ambulatory Visit | Attending: Family Medicine | Admitting: Family Medicine

## 2022-03-20 DIAGNOSIS — M4727 Other spondylosis with radiculopathy, lumbosacral region: Secondary | ICD-10-CM

## 2022-03-20 DIAGNOSIS — M5459 Other low back pain: Secondary | ICD-10-CM

## 2022-03-26 NOTE — Progress Notes (Signed)
Please advise 

## 2022-03-27 ENCOUNTER — Ambulatory Visit: Payer: Managed Care, Other (non HMO)

## 2022-03-27 DIAGNOSIS — M5459 Other low back pain: Secondary | ICD-10-CM | POA: Diagnosis not present

## 2022-03-27 NOTE — Therapy (Signed)
OUTPATIENT PHYSICAL THERAPY THORACOLUMBAR TREATMENT   Patient Name: Barbara Cortez MRN: [id number] DOB:01-03-1956, 66 y.o., female Today's Date: 03/27/2022   PT End of Session - 03/27/22 0758     Visit Number 3    Number of Visits 17    Date for PT Re-Evaluation 05/08/22    Authorization Type eval: 03/13/22    PT Start Time 0759    PT Stop Time 0845    PT Time Calculation (min) 46 min    Activity Tolerance Patient tolerated treatment well    Behavior During Therapy [facility] for tasks assessed/performed               Past Medical History:  Diagnosis Date   Anxiety    Broken arm, left, closed, initial encounter    Chronic insomnia    COPD (chronic obstructive pulmonary disease) ([place])    Diabetes mellitus without complication ([place])    Diverticulosis    Hypertension    Impaired glucose tolerance    Internal hemorrhoids    Past Surgical History:  Procedure Laterality Date   AUGMENTATION MAMMAPLASTY Bilateral 2000   COLONOSCOPY WITH PROPOFOL N/A 01/29/2015   Procedure: COLONOSCOPY WITH PROPOFOL;  Surgeon: Lollie Sails, MD;  Location: [facility] ENDOSCOPY;  Service: Endoscopy;  Laterality: N/A;   Hysteroscopy wtih enodmetrial ablation     Saline Breast Implants     SEPTOPLASTY     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   Lymphedema of both lower extremities 10/19/2018   Body mass index (BMI) of 35.0 to 35.9 with comorbidity 07/14/2018   Lumbosacral spondylosis with radiculopathy 07/14/2018   High risk medication use 06/19/2015   Tubular adenoma of colon 01/29/2015   Diabetes mellitus type II, controlled ([place]) 06/05/2014   Chronic insomnia 02/05/2014   Hyperlipidemia associated with type 2 diabetes mellitus ([place]) 02/05/2014   Hypertension associated with type 2 diabetes mellitus ([place]) 02/05/2014   Plantar fasciitis 02/05/2014    PCP: Juline Patch, MD  REFERRING PROVIDER: Montel Culver, MD  REFERRING DIAGNOSIS:  M47.27 (ICD-10-CM) - Lumbosacral spondylosis with radiculopathy   THERAPY DIAG: Other low back pain  RATIONALE FOR EVALUATION AND TREATMENT: Rehabilitation  ONSET DATE: 03/13/2012 (approximate)  FOLLOW UP APPT WITH PROVIDER: Yes, September 2023 with Dr. Zigmund Daniel  FROM INITIAL EVALUATION  SUBJECTIVE:                                                                                                                                                                                         Chief Complaint: Low back pain   Pertinent History Insidiout onset of chronic back  pain at least 10 years ago with progressive worsening. "I can barely flip over in bed." She completed a course of physical therapy approximately 1 year ago at [facility] but did not notice significant improvement at that time. Pain worsens if she sits for a prolonged period of time and then tries to stand up (pain occurs when trying to stand not with extended sitting). Her pain improves with progressive activity  such as walking. Pt notices that first thing in the morning she gets pain radiating down anterior/anterolateral R thigh to the level of the knee but never any farther. MD ordered a lumbar MRI which is currently scheduled for next Friday 03/20/22. She has been increasing her Gabapentin as instructed with some AM somnolence noted and no improvement in pain. No history of significant knee or hip pain. She purchased a new mattress but has not noticed any change in her back. Recent lumbar radiographs showed mild levoscoliosis. No recent fracture is seen. Degenerative changes are noted with disc space narrowing, bony spurs and facet hypertrophy from L2-S1 levels.  Pain:  Pain Intensity: Present: 1/10, Best: 1/10, Worst: 6/10 Pain location: Bilateral low back, worse on the right side Pain Quality: sharp and constant Radiating: Yes  Numbness/Tingling: No Focal Weakness: No Aggravating factors: Extended sitting, first thing in the  AM, bending (yard work), rolling in bed, bending over in standing, extending standing Relieving factors: Water aerobics ([employer] for the last month 2x/wk), heating pad, has tried ice without change, Celebrex, no real benefit with Flexeril or Gabapentin,  24-hour pain behavior: Worse in the AM, improves as day progresses How long can you sit: no limitations How long can you stand: At least an hour History of prior back injury, pain, surgery, or therapy: Yes, history of PT at [facility] clinic. No prior history of injury or pain Falls: Has patient fallen in last 6 months? 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 Hobbies: water aerobics, working around the house inside and outside, currently Marine scientist   Precautions: None  [employer] Restrictions: No  Living Environment Lives with: lives alone Lives in: House/apartment, two story right side railing to second floor, 2 steps outside no rails Has following equipment at home: Grab bars in walk-in shower  Patient Goals: "I would like to be able to sleep more comfortably."   OBJECTIVE:   Patient Surveys  Modified Oswestry To be completed  FOTO 72, predicted worsening to 68   Cognition Patient is oriented to person, place, and time.  Recent memory is intact.  Remote memory is intact.  Attention span and concentration are intact.  Expressive speech is intact.  Patient's fund of knowledge is within normal limits for educational level.     Gross Musculoskeletal Assessment Tremor: None Bulk: Normal Tone: Normal No visible step-off along spinal column, no signs of scoliosis   GAIT: Full assessment deferred, no gross deficits noted entering/exiting clinic   Posture: Lumbar lordosis: Excessive lumbar lordosis noted; Iliac crest height: Equal  bilaterally Lumbar lateral shift: Negative   AROM  AROM (Normal range in degrees) AROM  03/27/2022  Lumbar   Flexion (65) Composite flexion WNL, segmental flexion is significant impaired during forward bend  Extension (30) WNL  Right lateral flexion (25) WNL  Left lateral flexion (25) 25% limitation with R low back pulling  Right rotation (30) WNL  Left rotation (30) WNL      Hip Right Left  Flexion (125) WNL* low back pain at end range WNL  Extension (15)    Abduction (40) WNL WNL  Adduction  WNL WNL  Internal Rotation (45) >45 >45  External Rotation (45) >45 >45      Knee    Flexion (135) WNL WNL  Extension (0) WNL WNL      Ankle    Dorsiflexion (20)    Plantarflexion (50)    Inversion (35)    Eversion (15    (* = pain; Blank rows = not tested)   LE MMT:  MMT (out of 5) Right 03/27/2022 Left 03/27/2022  Hip flexion 5 5  Hip extension 3+ 3+  Hip abduction 4+ 4+  Hip adduction 4+ 4  Hip internal rotation 5 5  Hip external rotation 5 5  Knee flexion 4+ 4+  Knee extension 5 5  Ankle dorsiflexion 5 5  Ankle plantarflexion    Ankle inversion    Ankle eversion    (* = pain; Blank rows = not tested)   Sensation Grossly intact to light touch bilateral LEs as determined by testing dermatomes L2-S2. Proprioception, and hot/cold testing deferred on this date.   Reflexes Deferred   Muscle Length Hamstrings: R: Negative L: Negative   Palpation  Location LEFT  RIGHT           Lumbar paraspinals 0 0  Quadratus Lumborum 0 0  Gluteus Maximus 0 0  Gluteus Medius 1 1  Deep hip external rotators 0 0  PSIS 0 1  Fortin's Area (SIJ) 0 1  Greater Trochanter 0 0  (Blank rows = not tested) Graded on 0-4 scale (0 = no pain, 1 = pain, 2 = pain with wincing/grimacing/flinching, 3 = pain with withdrawal, 4 = unwilling to allow palpation), (Blank rows = not tested)   Passive Accessory Intervertebral Motion Pt denies reproduction of back pain with CPA L1-L5 and UPA  bilaterally L1-L5 with the exception of reproduction of low back pain with R UPA at L5. Normal mobility throughout    SPECIAL TESTS Lumbar Radiculopathy and Discogenic: Centralization and Peripheralization (SN 92, -LR 0.12): Positive for relief at home with repeated extension Slump (SN 83, -LR 0.32): R: Negative L: Negative SLR (SN 92, -LR 0.29): R: Negative L:  Negative Crossed SLR (SP 90): R: Negative L: Negative  Facet Joint: Extension-Rotation (SN 100, -LR 0.0): R: Negative L: Negative  Lumbar Foraminal Stenosis: Lumbar quadrant (SN 70): R: Negative L: Negative  Hip: FABER (SN 81): R: Negative L: Negative FADIR (SN 94): R: Positive for low back pain L: Negative Hip scour (SN 50): R: Negative L: Negative  SIJ:  Thigh Thrust (SN 88, -LR 0.18) : R: Not examined L: Not examined  Piriformis Syndrome: FAIR Test (SN 88, SP 83): R: Not examined L: Not examined  Functional Tasks Deferred  Beighton scale: History of possible hypermobility in youth  LEFT  RIGHT           1. Passive dorsiflexion and hyperextension of the fifth MCP joint beyond 90  0 0   2. Passive apposition of the thumb to the flexor aspect of the forearm  0  0   3. Passive hyperextension of the elbow beyond 10  0  0   4. Passive hyperextension of the knee beyond '10  1 1  '$ 5. Active forward flexion of the trunk with the knees fully extended so that the palms of  the hands rest flat on the floor   1   TOTAL         0/ 9     TODAY'S TREATMENT    SUBJECTIVE: Pt reports that she is doing well today. She denies any resting low back pain upon arrival. No symptoms in RLE and she has had multiple days of relief from radicular pain. No specific questions or concerns currently.   PAIN: Denies   Ther-ex  NuStep L1-3 x 6 minutes with moist heat pack on lumbar spine for warm-up during interval history (3 minutes unbilled); Hooklying lumbar rotation rocking x 60s; Hooklying anterior/posterior pelvic tilts 3s hold x 10  each; Hooklying SLR with transverse abdominis contraction x 10 BLE; Hooklying clams with blue tband 2 x 10 BLE; Hooklying adductor ball squeeze 2 x 10 BLE; Hooklying bridges with blue tband around knees 2 x 10; HEP updated and reviewed with patient;   Manual Therapy Single knee to chest stretch x 30s BLE; Hip FABER and FADIR stretches x 30s each BLE; Hamstring stretch with ankle DF/PF x 30s BLE; Long axis hip distraction throught BLE with belt x 2 minutes total; Prone CPA L3-L5, grade I-II, 30s/bout x 2 bouts/level STM to L lumbar paraspinals, L quadratus lumborum, and L glut max with theraband roller;    Not performed: Hooklying hip fallouts with transverse abdominis contraction x 10 BLE;    PATIENT EDUCATION:  Education details: Pt educated throughout session about proper posture and technique with exercises. Improved exercise technique, movement at target joints, use of target muscles after min to mod verbal, visual, tactile cues.  Person educated: Patient Education method: Explanation, Demonstration, and Verbal cues Education comprehension: verbalized understanding and returned demonstration   HOME EXERCISE PROGRAM: Access Code: [email or web address] URL: [email or web address] Date: 03/27/2022 Prepared by: Roxana Hires  Exercises - Supine Pelvic Tilt  - 1 x daily - 7 x weekly - 2 sets - 10 reps - 3s hold - Hooklying Lumbar Rotation  - 1 x daily - 7 x weekly - 2 sets - 10 reps - 3s hold - Supine Hip Adduction Isometric with Ball  - 1 x daily - 7 x weekly - 2 sets - 10 reps - 3s hold - Hooklying Clamshell with Resistance  - 1 x daily - 7 x weekly - 2 sets - 10 reps - 3s hold - Supine Bridge with Resistance Band  - 1 x daily - 7 x weekly - 2 sets - 10 reps - 3s hold  Patient Education - What Is Pain? - Treating Persistent Pain Without Medications: Exercise - Treating Persistent Pain Without Medications: PT, OT, and TENS Therapy   ASSESSMENT:  CLINICAL  IMPRESSION: Patient demonstrates excellent motivation during session today. Introduced lumbar PAM and continued exercises for pain-free AROM and light strengthening. Added bridges which pt is able to complete without pain. HEP updated and pt encouraged to follow-up as scheduled. She will benefit from skilled PT to address above impairments and improve overall function.  REHAB POTENTIAL: Good  CLINICAL DECISION MAKING: Evolving/moderate complexity  EVALUATION COMPLEXITY: Moderate   GOALS: Goals reviewed with patient? No  SHORT TERM GOALS: Target date: 04/10/2022  Pt will be independent with HEP to improve strength and decrease back pain to improve pain-free function at home and work. Baseline:  Goal status: INITIAL   LONG TERM GOALS: Target date: 05/08/2022   1.  Pt will decrease worst back pain by at least 2 points on the NPRS in order to demonstrate clinically significant reduction  in back pain. Baseline: 03/13/22: Worst 6/10 Goal status: INITIAL  2.  Pt will decrease mODI score by at least 13 points in order demonstrate clinically significant reduction in back pain/disability.       Baseline: 03/13/22: To be completed; 03/20/22: 18% Goal status: INITIAL  3.  Pt will report at least 50% improvement in her back pain in order to return to full function at home, work, and with exercise with less pain Baseline:  Goal status: INITIAL   PLAN: PT FREQUENCY: 1-2x/week  PT DURATION: 8 weeks  PLANNED INTERVENTIONS: Therapeutic exercises, Therapeutic activity, Neuromuscular re-education, Balance training, Gait training, Patient/Family education, Joint manipulation, Joint mobilization, Canalith repositioning, Aquatic Therapy, Dry Needling, Cognitive remediation, Electrical stimulation, Spinal manipulation, Spinal mobilization, Cryotherapy, Moist heat, Traction, Ultrasound, Ionotophoresis '4mg'$ /ml Dexamethasone, and Manual therapy  PLAN FOR NEXT SESSION: Continue exercises and manual  techniques, review/modify HEP as necessary;   Lyndel Safe Kristien Salatino PT, DPT, GCS  Kenith Trickel 03/27/2022, 9:06 AM

## 2022-03-31 NOTE — Therapy (Signed)
OUTPATIENT PHYSICAL THERAPY THORACOLUMBAR TREATMENT   Patient Name: Barbara Cortez MRN: [id number] DOB:09-02-1955, 66 y.o., female Today's Date: 04/03/2022   PT End of Session - 04/03/22 0802     Visit Number 4    Number of Visits 17    Date for PT Re-Evaluation 05/08/22    Authorization Type eval: 03/13/22    PT Start Time 0800    PT Stop Time 0845    PT Time Calculation (min) 45 min    Activity Tolerance Patient tolerated treatment well    Behavior During Therapy WFL for tasks assessed/performed              Past Medical History:  Diagnosis Date   Anxiety    Broken arm, left, closed, initial encounter    Chronic insomnia    COPD (chronic obstructive pulmonary disease) ([place])    Diabetes mellitus without complication ([place])    Diverticulosis    Hypertension    Impaired glucose tolerance    Internal hemorrhoids    Past Surgical History:  Procedure Laterality Date   AUGMENTATION MAMMAPLASTY Bilateral 2000   COLONOSCOPY WITH PROPOFOL N/A 01/29/2015   Procedure: COLONOSCOPY WITH PROPOFOL;  Surgeon: Lollie Sails, MD;  Location: [facility] ENDOSCOPY;  Service: Endoscopy;  Laterality: N/A;   Hysteroscopy wtih enodmetrial ablation     Saline Breast Implants     SEPTOPLASTY     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   Lymphedema of both lower extremities 10/19/2018   Body mass index (BMI) of 35.0 to 35.9 with comorbidity 07/14/2018   Lumbosacral spondylosis with radiculopathy 07/14/2018   High risk medication use 06/19/2015   Tubular adenoma of colon 01/29/2015   Diabetes mellitus type II, controlled ([place]) 06/05/2014   Chronic insomnia 02/05/2014   Hyperlipidemia associated with type 2 diabetes mellitus ([place]) 02/05/2014   Hypertension associated with type 2 diabetes mellitus ([place]) 02/05/2014   Plantar fasciitis 02/05/2014    PCP: Juline Patch, MD  REFERRING PROVIDER: Montel Culver, MD  REFERRING DIAGNOSIS: M47.27  (ICD-10-CM) - Lumbosacral spondylosis with radiculopathy   THERAPY DIAG: Other low back pain  RATIONALE FOR EVALUATION AND TREATMENT: Rehabilitation  ONSET DATE: 03/13/2012 (approximate)  FOLLOW UP APPT WITH PROVIDER: Yes, September 2023 with Dr. Zigmund Daniel  FROM INITIAL EVALUATION  SUBJECTIVE:                                                                                                                                                                                         Chief Complaint: Low back pain   Pertinent History Insidiout onset of chronic back pain  at least 10 years ago with progressive worsening. "I can barely flip over in bed." She completed a course of physical therapy approximately 1 year ago at [facility] but did not notice significant improvement at that time. Pain worsens if she sits for a prolonged period of time and then tries to stand up (pain occurs when trying to stand not with extended sitting). Her pain improves with progressive activity  such as walking. Pt notices that first thing in the morning she gets pain radiating down anterior/anterolateral R thigh to the level of the knee but never any farther. MD ordered a lumbar MRI which is currently scheduled for next Friday 03/20/22. She has been increasing her Gabapentin as instructed with some AM somnolence noted and no improvement in pain. No history of significant knee or hip pain. She purchased a new mattress but has not noticed any change in her back. Recent lumbar radiographs showed mild levoscoliosis. No recent fracture is seen. Degenerative changes are noted with disc space narrowing, bony spurs and facet hypertrophy from L2-S1 levels.  Pain:  Pain Intensity: Present: 1/10, Best: 1/10, Worst: 6/10 Pain location: Bilateral low back, worse on the right side Pain Quality: sharp and constant Radiating: Yes  Numbness/Tingling: No Focal Weakness: No Aggravating factors: Extended sitting, first thing in the AM,  bending (yard work), rolling in bed, bending over in standing, extending standing Relieving factors: Water aerobics ([place] for the last month 2x/wk), heating pad, has tried ice without change, Celebrex, no real benefit with Flexeril or Gabapentin,  24-hour pain behavior: Worse in the AM, improves as day progresses How long can you sit: no limitations How long can you stand: At least an hour History of prior back injury, pain, surgery, or therapy: Yes, history of PT at [facility] clinic. No prior history of injury or pain Falls: Has patient fallen in last 6 months? 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 Hobbies: water aerobics, working around the house inside and outside, currently Marine scientist   Precautions: None  [employer] Restrictions: No  Living Environment Lives with: lives alone Lives in: House/apartment, two story right side railing to second floor, 2 steps outside no rails Has following equipment at home: Grab bars in walk-in shower  Patient Goals: "I would like to be able to sleep more comfortably."   OBJECTIVE:   Patient Surveys  Modified Oswestry To be completed  FOTO 72, predicted worsening to 41   Cognition Patient is oriented to person, place, and time.  Recent memory is intact.  Remote memory is intact.  Attention span and concentration are intact.  Expressive speech is intact.  Patient's fund of knowledge is within normal limits for educational level.     Gross Musculoskeletal Assessment Tremor: None Bulk: Normal Tone: Normal No visible step-off along spinal column, no signs of scoliosis   GAIT: Full assessment deferred, no gross deficits noted entering/exiting clinic   Posture: Lumbar lordosis: Excessive lumbar lordosis noted; Iliac crest height: Equal  bilaterally Lumbar lateral shift: Negative   AROM  AROM (Normal range in degrees) AROM  04/03/2022  Lumbar   Flexion (65) Composite flexion WNL, segmental flexion is significant impaired during forward bend  Extension (30) WNL  Right lateral flexion (25) WNL  Left lateral flexion (25) 25% limitation with R low back pulling  Right rotation (30) WNL  Left  rotation (30) WNL      Hip Right Left  Flexion (125) WNL* low back pain at end range WNL  Extension (15)    Abduction (40) WNL WNL  Adduction  WNL WNL  Internal Rotation (45) >45 >45  External Rotation (45) >45 >45      Knee    Flexion (135) WNL WNL  Extension (0) WNL WNL      Ankle    Dorsiflexion (20)    Plantarflexion (50)    Inversion (35)    Eversion (15    (* = pain; Blank rows = not tested)   LE MMT:  MMT (out of 5) Right 04/03/2022 Left 04/03/2022  Hip flexion 5 5  Hip extension 3+ 3+  Hip abduction 4+ 4+  Hip adduction 4+ 4  Hip internal rotation 5 5  Hip external rotation 5 5  Knee flexion 4+ 4+  Knee extension 5 5  Ankle dorsiflexion 5 5  Ankle plantarflexion    Ankle inversion    Ankle eversion    (* = pain; Blank rows = not tested)   Sensation Grossly intact to light touch bilateral LEs as determined by testing dermatomes L2-S2. Proprioception, and hot/cold testing deferred on this date.   Reflexes Deferred   Muscle Length Hamstrings: R: Negative L: Negative   Palpation  Location LEFT  RIGHT           Lumbar paraspinals 0 0  Quadratus Lumborum 0 0  Gluteus Maximus 0 0  Gluteus Medius 1 1  Deep hip external rotators 0 0  PSIS 0 1  Fortin's Area (SIJ) 0 1  Greater Trochanter 0 0  (Blank rows = not tested) Graded on 0-4 scale (0 = no pain, 1 = pain, 2 = pain with wincing/grimacing/flinching, 3 = pain with withdrawal, 4 = unwilling to allow palpation), (Blank rows = not tested)   Passive Accessory Intervertebral Motion Pt denies reproduction of back pain with CPA L1-L5 and UPA  bilaterally L1-L5 with the exception of reproduction of low back pain with R UPA at L5. Normal mobility throughout    SPECIAL TESTS Lumbar Radiculopathy and Discogenic: Centralization and Peripheralization (SN 92, -LR 0.12): Positive for relief at home with repeated extension Slump (SN 83, -LR 0.32): R: Negative L: Negative SLR (SN 92, -LR 0.29): R: Negative L:  Negative Crossed SLR (SP 90): R: Negative L: Negative  Facet Joint: Extension-Rotation (SN 100, -LR 0.0): R: Negative L: Negative  Lumbar Foraminal Stenosis: Lumbar quadrant (SN 70): R: Negative L: Negative  Hip: FABER (SN 81): R: Negative L: Negative FADIR (SN 94): R: Positive for low back pain L: Negative Hip scour (SN 50): R: Negative L: Negative  SIJ:  Thigh Thrust (SN 88, -LR 0.18) : R: Not examined L: Not examined  Piriformis Syndrome: FAIR Test (SN 88, SP 83): R: Not examined L: Not examined  Functional Tasks Deferred  Beighton scale: History of possible hypermobility in youth  LEFT  RIGHT           1. Passive dorsiflexion and hyperextension of the fifth MCP joint beyond 90  0 0   2. Passive apposition of the thumb to the flexor aspect of the forearm  0  0   3. Passive hyperextension of the elbow beyond 10  0  0   4. Passive hyperextension of the knee beyond '10  1 1  '$ 5. Active forward flexion of the trunk with the knees fully extended so that the palms of the  hands rest flat on the floor   1   TOTAL         0/ 9     TODAY'S TREATMENT    SUBJECTIVE: Pt reports that she is doing well today. She denies any resting low back pain upon arrival. Significant improvement in RLE symptoms. No specific questions or concerns currently.   PAIN: Denies   Ther-ex  NuStep L1-3 x 6 minutes with moist heat pack on lumbar spine for warm-up during interval history (3 minutes unbilled); Attempted single knee to chest stretch but no stretch sensation appreciated by patient so terminated; Hip FABER and FADIR stretches x  45s each BLE; Hooklying lumbar rotation rocking x 60s; Hooklying anterior/posterior pelvic tilts 3s hold x 10 each; Hooklying SLR with transverse abdominis contraction (coordinated breath) x 10 BLE; Hooklying clams with manual resistance x 10, blue tband x 15; Hooklying adductor squeeze with manual resistance x 10, ball x 10; Hooklying bridges with blue tband around knees 2 x 15; Supine 90/90 bike with swiss ball on stomach x 10; Hooklying bridges with alternating march 2 x 10; Seated bilateral shoulder flexion with 3# dumbbells with transverse abdominis contraction (coordinated breath); Seated swiss ball forward flexion roll out stretch 10s hold/10s relax x 10;   Not performed: Hooklying hip fallouts with transverse abdominis contraction x 10 BLE; Long axis hip distraction throught BLE with belt x 2 minutes total; Prone CPA L3-L5, grade I-II, 30s/bout x 2 bouts/level Hamstring stretch with ankle DF/PF x 30s BLE; STM to L lumbar paraspinals, L quadratus lumborum, and L glut max with theraband roller;     PATIENT EDUCATION:  Education details: Pt educated throughout session about proper posture and technique with exercises. Improved exercise technique, movement at target joints, use of target muscles after min to mod verbal, visual, tactile cues.  Person educated: Patient Education method: Explanation, Demonstration, and Verbal cues Education comprehension: verbalized understanding and returned demonstration   HOME EXERCISE PROGRAM: Access Code: [email or web address] URL: [email or web address] Date: 03/27/2022 Prepared by: Roxana Hires  Exercises - Supine Pelvic Tilt  - 1 x daily - 7 x weekly - 2 sets - 10 reps - 3s hold - Hooklying Lumbar Rotation  - 1 x daily - 7 x weekly - 2 sets - 10 reps - 3s hold - Supine Hip Adduction Isometric with Ball  - 1 x daily - 7 x weekly - 2 sets - 10 reps - 3s hold - Hooklying Clamshell with Resistance  - 1 x daily - 7 x weekly - 2 sets - 10  reps - 3s hold - Supine Bridge with Resistance Band  - 1 x daily - 7 x weekly - 2 sets - 10 reps - 3s hold  Patient Education - What Is Pain? - Treating Persistent Pain Without Medications: Exercise - Treating Persistent Pain Without Medications: PT, OT, and TENS Therapy   ASSESSMENT:  CLINICAL IMPRESSION: Patient demonstrates excellent motivation during session today. Continued with with progressive stabilization program and advanced difficulty. Pt is able to complete exercises with appropriate challenge and no increase in back pain. No HEP updates on this visit. Will continue to progress abdominal, lumbar, and hip strengthening and future visits. Pt encouraged to follow-up as scheduled. She will benefit from skilled PT to address above impairments and improve overall function.  REHAB POTENTIAL: Good  CLINICAL DECISION MAKING: Evolving/moderate complexity  EVALUATION COMPLEXITY: Moderate   GOALS: Goals reviewed with patient? No  SHORT TERM GOALS: Target date: 04/10/2022  Pt will be independent with  HEP to improve strength and decrease back pain to improve pain-free function at home and work. Baseline:  Goal status: INITIAL   LONG TERM GOALS: Target date: 05/08/2022   1.  Pt will decrease worst back pain by at least 2 points on the NPRS in order to demonstrate clinically significant reduction in back pain. Baseline: 03/13/22: Worst 6/10 Goal status: INITIAL  2.  Pt will decrease mODI score by at least 13 points in order demonstrate clinically significant reduction in back pain/disability.       Baseline: 03/13/22: To be completed; 03/20/22: 18% Goal status: INITIAL  3.  Pt will report at least 50% improvement in her back pain in order to return to full function at home, work, and with exercise with less pain Baseline:  Goal status: INITIAL   PLAN: PT FREQUENCY: 1-2x/week  PT DURATION: 8 weeks  PLANNED INTERVENTIONS: Therapeutic exercises, Therapeutic activity,  Neuromuscular re-education, Balance training, Gait training, Patient/Family education, Joint manipulation, Joint mobilization, Canalith repositioning, Aquatic Therapy, Dry Needling, Cognitive remediation, Electrical stimulation, Spinal manipulation, Spinal mobilization, Cryotherapy, Moist heat, Traction, Ultrasound, Ionotophoresis '4mg'$ /ml Dexamethasone, and Manual therapy  PLAN FOR NEXT SESSION: Continue exercises and manual techniques, review/modify HEP as necessary;   Lyndel Safe Karyna Bessler PT, DPT, GCS  Takyra Cantrall 04/03/2022, 8:52 AM

## 2022-04-02 ENCOUNTER — Other Ambulatory Visit: Payer: Self-pay | Admitting: Family Medicine

## 2022-04-02 DIAGNOSIS — M4727 Other spondylosis with radiculopathy, lumbosacral region: Secondary | ICD-10-CM

## 2022-04-02 NOTE — Telephone Encounter (Signed)
Will refuse this request due to provider d/c'd this dose on 03/06/22.   Requested Prescriptions  Pending Prescriptions Disp Refills  . gabapentin (NEURONTIN) 100 MG capsule [Pharmacy Med Name: GABAPENTIN '100MG'$  CAPSULES] 30 capsule 0    Sig: TAKE 1 CAPSULE(100 MG) BY MOUTH AT BEDTIME     Neurology: Anticonvulsants - gabapentin Passed - 04/02/2022  3:33 AM      Passed - Cr in normal range and within 360 days    Creatinine, Ser  Date Value Ref Range Status  06/24/2021 0.76 0.57 - 1.00 mg/dL Final         Passed - Completed PHQ-2 or PHQ-9 in the last 360 days      Passed - Valid encounter within last 12 months    Recent Outpatient Visits          3 weeks ago Lumbosacral spondylosis with radiculopathy   [place] [facility] at [place], Earley Abide, MD   1 month ago Lumbosacral spondylosis with radiculopathy   [place] [facility] at [place], Earley Abide, MD   2 months ago Allergic conjunctivitis of both eyes   [place] [facility] at [place], Deanna C, MD   2 months ago Hypertension associated with type 2 diabetes mellitus ([place])   [facility] at [place], [place], MD   6 months ago Hypertension associated with type 2 diabetes mellitus [facility])    Primary Care and Sports Medicine at [place], [place], MD      Future Appointments            In 3 weeks Juline Patch, MD [facility] Primary Care and Sports Medicine at [facility], [place]   In 3 months Juline Patch, MD [place] and Sports Medicine at [facility], [facility]

## 2022-04-03 ENCOUNTER — Ambulatory Visit: Payer: Managed Care, Other (non HMO) | Attending: Family Medicine

## 2022-04-03 DIAGNOSIS — M5459 Other low back pain: Secondary | ICD-10-CM | POA: Insufficient documentation

## 2022-04-03 NOTE — Telephone Encounter (Signed)
Requested medication (s) are due for refill today - no  Requested medication (s) are on the active medication list -yes  Future visit scheduled -yes  Last refill: 03/06/22 #90 2RF  Notes to clinic: fails lab protocol, should have RF  Requested Prescriptions  Pending Prescriptions Disp Refills   celecoxib (CELEBREX) 100 MG capsule [Pharmacy Med Name: CELECOXIB 100MG CAPSULES] 60 capsule     Sig: TAKE 1 CAPSULE(100 MG) BY MOUTH TWICE DAILY     Analgesics:  COX2 Inhibitors Failed - 04/02/2022  2:58 PM      Failed - Manual Review: Labs are only required if the patient has taken medication for more than 8 weeks.      Failed - HGB in normal range and within 360 days    No results found for: "HGB", "HGBKUC", "HGBPOCKUC", "HGBOTHER", "TOTHGB", "HGBPLASMA"       Failed - HCT in normal range and within 360 days    No results found for: "HCT", "HCTKUC", "SRHCT"       Failed - AST in normal range and within 360 days    No results found for: "POCAST", "AST"       Failed - ALT in normal range and within 360 days    No results found for: "ALT", "LABALT", "POCALT"       Passed - Cr in normal range and within 360 days    Creatinine, Ser  Date Value Ref Range Status  06/24/2021 0.76 0.57 - 1.00 mg/dL Final         Passed - eGFR is 30 or above and within 360 days    eGFR  Date Value Ref Range Status  06/24/2021 87 >59 mL/min/1.73 Final         Passed - Patient is not pregnant      Passed - Valid encounter within last 12 months    Recent Outpatient Visits           4 weeks ago Lumbosacral spondylosis with radiculopathy   [place] [facility] at [place], Earley Abide, MD   1 month ago Lumbosacral spondylosis with radiculopathy   [place] [facility] at [place], Earley Abide, MD   2 months ago Allergic conjunctivitis of both eyes   [place] [facility] at [facility], MD    2 months ago Hypertension associated with type 2 diabetes mellitus ([place])   [place] [facility] at [facility], MD   6 months ago Hypertension associated with type 2 diabetes mellitus ([place])   [place] [facility] at [place], [place], MD       Future Appointments             In 3 weeks Juline Patch, MD [facility] Primary Care and Sports Medicine at [facility], [place]   In 3 months Juline Patch, MD [facility] Primary Care and Sports Medicine at [facility], [facility]               Requested Prescriptions  Pending Prescriptions Disp Refills   celecoxib (CELEBREX) 100 MG capsule [Pharmacy Med Name: CELECOXIB 100MG CAPSULES] 60 capsule     Sig: TAKE 1 CAPSULE(100 MG) BY MOUTH TWICE DAILY     Analgesics:  COX2 Inhibitors Failed - 04/02/2022  2:58 PM      Failed - Manual Review: Labs are only  required if the patient has taken medication for more than 8 weeks.      Failed - HGB in normal range and within 360 days    No results found for: "HGB", "HGBKUC", "HGBPOCKUC", "HGBOTHER", "TOTHGB", "HGBPLASMA"       Failed - HCT in normal range and within 360 days    No results found for: "HCT", "HCTKUC", "SRHCT"       Failed - AST in normal range and within 360 days    No results found for: "POCAST", "AST"       Failed - ALT in normal range and within 360 days    No results found for: "ALT", "LABALT", "POCALT"       Passed - Cr in normal range and within 360 days    Creatinine, Ser  Date Value Ref Range Status  06/24/2021 0.76 0.57 - 1.00 mg/dL Final         Passed - eGFR is 30 or above and within 360 days    eGFR  Date Value Ref Range Status  06/24/2021 87 >59 mL/min/1.73 Final         Passed - Patient is not pregnant      Passed - Valid encounter within last 12 months    Recent Outpatient Visits           4 weeks ago Lumbosacral spondylosis with radiculopathy   [place]  Primary Care and Sports Medicine at [place], Earley Abide, MD   1 month ago Lumbosacral spondylosis with radiculopathy   [place] [facility] at [place], Earley Abide, MD   2 months ago Allergic conjunctivitis of both eyes   [place] [facility] and [facility] at [place], [place], MD   2 months ago Hypertension associated with type 2 diabetes mellitus ([place])   [place] [facility] and [facility] Medicine at [place], [place], MD   6 months ago Hypertension associated with type 2 diabetes mellitus [facility])   [place] Primary Care and Sports Medicine at [place], [place], MD       Future Appointments             In 3 weeks Juline Patch, MD [facility] Primary Care and Sports Medicine at [facility], [place]   In 3 months Juline Patch, MD [place] and Sports Medicine at [facility], [facility]

## 2022-04-10 ENCOUNTER — Ambulatory Visit: Payer: Managed Care, Other (non HMO)

## 2022-04-10 DIAGNOSIS — M5459 Other low back pain: Secondary | ICD-10-CM | POA: Diagnosis not present

## 2022-04-10 NOTE — Therapy (Signed)
OUTPATIENT PHYSICAL THERAPY THORACOLUMBAR TREATMENT   Patient Name: Barbara Cortez MRN: [id number] DOB:1956-05-16, 66 y.o., female Today's Date: 04/10/2022   PT End of Session - 04/10/22 0804     Visit Number 5    Number of Visits 17    Date for PT Re-Evaluation 05/08/22    Authorization Type eval: 03/13/22    PT Start Time 0800    PT Stop Time 0845    PT Time Calculation (min) 45 min    Activity Tolerance Patient tolerated treatment well    Behavior During Therapy [facility] for tasks assessed/performed              Past Medical History:  Diagnosis Date   Anxiety    Broken arm, left, closed, initial encounter    Chronic insomnia    COPD (chronic obstructive pulmonary disease) ([place])    Diabetes mellitus without complication ([place])    Diverticulosis    Hypertension    Impaired glucose tolerance    Internal hemorrhoids    Past Surgical History:  Procedure Laterality Date   AUGMENTATION MAMMAPLASTY Bilateral 2000   COLONOSCOPY WITH PROPOFOL N/A 01/29/2015   Procedure: COLONOSCOPY WITH PROPOFOL;  Surgeon: Lollie Sails, MD;  Location: [facility] ENDOSCOPY;  Service: Endoscopy;  Laterality: N/A;   Hysteroscopy wtih enodmetrial ablation     Saline Breast Implants     SEPTOPLASTY     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   Lymphedema of both lower extremities 10/19/2018   Body mass index (BMI) of 35.0 to 35.9 with comorbidity 07/14/2018   Lumbosacral spondylosis with radiculopathy 07/14/2018   High risk medication use 06/19/2015   Tubular adenoma of colon 01/29/2015   Diabetes mellitus type II, controlled ([place]) 06/05/2014   Chronic insomnia 02/05/2014   Hyperlipidemia associated with type 2 diabetes mellitus ([place]) 02/05/2014   Hypertension associated with type 2 diabetes mellitus ([place]) 02/05/2014   Plantar fasciitis 02/05/2014    PCP: Juline Patch, MD  REFERRING PROVIDER: Montel Culver, MD  REFERRING DIAGNOSIS: M47.27  (ICD-10-CM) - Lumbosacral spondylosis with radiculopathy   THERAPY DIAG: Other low back pain  RATIONALE FOR EVALUATION AND TREATMENT: Rehabilitation  ONSET DATE: 03/13/2012 (approximate)  FOLLOW UP APPT WITH PROVIDER: Yes, September 2023 with Dr. Zigmund Daniel  FROM INITIAL EVALUATION  SUBJECTIVE:                                                                                                                                                                                         Chief Complaint: Low back pain   Pertinent History Insidiout onset of chronic back pain  at least 10 years ago with progressive worsening. "I can barely flip over in bed." She completed a course of physical therapy approximately 1 year ago at [facility] but did not notice significant improvement at that time. Pain worsens if she sits for a prolonged period of time and then tries to stand up (pain occurs when trying to stand not with extended sitting). Her pain improves with progressive activity  such as walking. Pt notices that first thing in the morning she gets pain radiating down anterior/anterolateral R thigh to the level of the knee but never any farther. MD ordered a lumbar MRI which is currently scheduled for next Friday 03/20/22. She has been increasing her Gabapentin as instructed with some AM somnolence noted and no improvement in pain. No history of significant knee or hip pain. She purchased a new mattress but has not noticed any change in her back. Recent lumbar radiographs showed mild levoscoliosis. No recent fracture is seen. Degenerative changes are noted with disc space narrowing, bony spurs and facet hypertrophy from L2-S1 levels.  Pain:  Pain Intensity: Present: 1/10, Best: 1/10, Worst: 6/10 Pain location: Bilateral low back, worse on the right side Pain Quality: sharp and constant Radiating: Yes  Numbness/Tingling: No Focal Weakness: No Aggravating factors: Extended sitting, first thing in the AM,  bending (yard work), rolling in bed, bending over in standing, extending standing Relieving factors: Water aerobics ([place] for the last month 2x/wk), heating pad, has tried ice without change, Celebrex, no real benefit with Flexeril or Gabapentin,  24-hour pain behavior: Worse in the AM, improves as day progresses How long can you sit: no limitations How long can you stand: At least an hour History of prior back injury, pain, surgery, or therapy: Yes, history of PT at [facility] clinic. No prior history of injury or pain Falls: Has patient fallen in last 6 months? 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 Hobbies: water aerobics, working around the house inside and outside, currently Marine scientist   Precautions: None  [employer] Restrictions: No  Living Environment Lives with: lives alone Lives in: House/apartment, two story right side railing to second floor, 2 steps outside no rails Has following equipment at home: Grab bars in walk-in shower  Patient Goals: "I would like to be able to sleep more comfortably."   OBJECTIVE:   Patient Surveys  Modified Oswestry To be completed  FOTO 72, predicted worsening to 28   Cognition Patient is oriented to person, place, and time.  Recent memory is intact.  Remote memory is intact.  Attention span and concentration are intact.  Expressive speech is intact.  Patient's fund of knowledge is within normal limits for educational level.     Gross Musculoskeletal Assessment Tremor: None Bulk: Normal Tone: Normal No visible step-off along spinal column, no signs of scoliosis   GAIT: Full assessment deferred, no gross deficits noted entering/exiting clinic   Posture: Lumbar lordosis: Excessive lumbar lordosis noted; Iliac crest height: Equal  bilaterally Lumbar lateral shift: Negative   AROM  AROM (Normal range in degrees) AROM  04/10/2022  Lumbar   Flexion (65) Composite flexion WNL, segmental flexion is significant impaired during forward bend  Extension (30) WNL  Right lateral flexion (25) WNL  Left lateral flexion (25) 25% limitation with R low back pulling  Right rotation (30) WNL  Left  rotation (30) WNL      Hip Right Left  Flexion (125) WNL* low back pain at end range WNL  Extension (15)    Abduction (40) WNL WNL  Adduction  WNL WNL  Internal Rotation (45) >45 >45  External Rotation (45) >45 >45      Knee    Flexion (135) WNL WNL  Extension (0) WNL WNL      Ankle    Dorsiflexion (20)    Plantarflexion (50)    Inversion (35)    Eversion (15    (* = pain; Blank rows = not tested)   LE MMT:  MMT (out of 5) Right 04/10/2022 Left 04/10/2022  Hip flexion 5 5  Hip extension 3+ 3+  Hip abduction 4+ 4+  Hip adduction 4+ 4  Hip internal rotation 5 5  Hip external rotation 5 5  Knee flexion 4+ 4+  Knee extension 5 5  Ankle dorsiflexion 5 5  Ankle plantarflexion    Ankle inversion    Ankle eversion    (* = pain; Blank rows = not tested)   Sensation Grossly intact to light touch bilateral LEs as determined by testing dermatomes L2-S2. Proprioception, and hot/cold testing deferred on this date.   Reflexes Deferred   Muscle Length Hamstrings: R: Negative L: Negative   Palpation  Location LEFT  RIGHT           Lumbar paraspinals 0 0  Quadratus Lumborum 0 0  Gluteus Maximus 0 0  Gluteus Medius 1 1  Deep hip external rotators 0 0  PSIS 0 1  Fortin's Area (SIJ) 0 1  Greater Trochanter 0 0  (Blank rows = not tested) Graded on 0-4 scale (0 = no pain, 1 = pain, 2 = pain with wincing/grimacing/flinching, 3 = pain with withdrawal, 4 = unwilling to allow palpation), (Blank rows = not tested)   Passive Accessory Intervertebral Motion Pt denies reproduction of back pain with CPA L1-L5 and UPA  bilaterally L1-L5 with the exception of reproduction of low back pain with R UPA at L5. Normal mobility throughout    SPECIAL TESTS Lumbar Radiculopathy and Discogenic: Centralization and Peripheralization (SN 92, -LR 0.12): Positive for relief at home with repeated extension Slump (SN 83, -LR 0.32): R: Negative L: Negative SLR (SN 92, -LR 0.29): R: Negative L:  Negative Crossed SLR (SP 90): R: Negative L: Negative  Facet Joint: Extension-Rotation (SN 100, -LR 0.0): R: Negative L: Negative  Lumbar Foraminal Stenosis: Lumbar quadrant (SN 70): R: Negative L: Negative  Hip: FABER (SN 81): R: Negative L: Negative FADIR (SN 94): R: Positive for low back pain L: Negative Hip scour (SN 50): R: Negative L: Negative  SIJ:  Thigh Thrust (SN 88, -LR 0.18) : R: Not examined L: Not examined  Piriformis Syndrome: FAIR Test (SN 88, SP 83): R: Not examined L: Not examined  Functional Tasks Deferred  Beighton scale: History of possible hypermobility in youth  LEFT  RIGHT           1. Passive dorsiflexion and hyperextension of the fifth MCP joint beyond 90  0 0   2. Passive apposition of the thumb to the flexor aspect of the forearm  0  0   3. Passive hyperextension of the elbow beyond 10  0  0   4. Passive hyperextension of the knee beyond '10  1 1  '$ 5. Active forward flexion of the trunk with the knees fully extended so that the palms of the  hands rest flat on the floor   1   TOTAL         0/ 9     TODAY'S TREATMENT    SUBJECTIVE: Pt reports that she is doing well today. She denies any resting low back pain upon arrival.  She feels like her R LE is feeling better overall.     PAIN: Denies   Ther-ex  NuStep L1-3 x 6 minutes with moist heat pack on lumbar spine for warm-up during interval history (3 minutes unbilled); Attempted single knee to chest stretch but no stretch sensation appreciated by patient so terminated; Hip FABER and FADIR stretches x 45s each BLE; Hooklying lumbar  rotation rocking x 60s; Hooklying anterior/posterior pelvic tilts 3s hold x 10 each; Hooklying SLR with transverse abdominis contraction (coordinated breath) x 10 BLE; Hooklying clams with manual resistance x 10, blue tband x 15; Hooklying adductor squeeze with manual resistance ball x 10; and added bridge too x10 (5 sec hold) Hooklying bridges with blue tband around knees 2 x 15; Supine 90/90 bike with swiss ball on stomach x 10; alternate LE extension 3x6 Hooklying bridges with alternating march 2 x 10; Seated bilateral shoulder flexion with 3# dumbbells with transverse abdominis contraction (coordinated breath); Seated swiss ball forward flexion roll out stretch 10s hold/10s relax x 10;   Not performed: Hooklying hip fallouts with transverse abdominis contraction x 10 BLE; Long axis hip distraction throught BLE with belt x 2 minutes total; Prone CPA L3-L5, grade I-II, 30s/bout x 2 bouts/level Hamstring stretch with ankle DF/PF x 30s BLE; STM to L lumbar paraspinals, L quadratus lumborum, and L glut max with theraband roller;     PATIENT EDUCATION:  Education details: Pt educated throughout session about proper posture and technique with exercises. Improved exercise technique, movement at target joints, use of target muscles after min to mod verbal, visual, tactile cues.  Person educated: Patient Education method: Explanation, Demonstration, and Verbal cues Education comprehension: verbalized understanding and returned demonstration   HOME EXERCISE PROGRAM: Access Code: [email or web address] URL: [email or web address] Date: 03/27/2022 Prepared by: Roxana Hires  Exercises - Supine Pelvic Tilt  - 1 x daily - 7 x weekly - 2 sets - 10 reps - 3s hold - Hooklying Lumbar Rotation  - 1 x daily - 7 x weekly - 2 sets - 10 reps - 3s hold - Supine Hip Adduction Isometric with Ball  - 1 x daily - 7 x weekly - 2 sets - 10 reps - 3s hold - Hooklying Clamshell with Resistance  - 1 x daily -  7 x weekly - 2 sets - 10 reps - 3s hold - Supine Bridge with Resistance Band  - 1 x daily - 7 x weekly - 2 sets - 10 reps - 3s hold  Patient Education - What Is Pain? - Treating Persistent Pain Without Medications: Exercise - Treating Persistent Pain Without Medications: PT, OT, and TENS Therapy   ASSESSMENT:  CLINICAL IMPRESSION:  Patient demonstrates excellent motivation during session today. Progressed lumbar muscle stabilization retraining program today.  Pt was challenged with therapeutic exercises but did not note an increase in LBP during or after tx.   Pt encouraged to follow-up as scheduled. She will benefit from skilled PT to address above impairments and improve overall function.  REHAB POTENTIAL: Good  CLINICAL DECISION MAKING: Evolving/moderate complexity  EVALUATION COMPLEXITY: Moderate   GOALS: Goals reviewed with patient? No  SHORT TERM GOALS: Target date: 04/10/2022  Pt will be independent with HEP  to improve strength and decrease back pain to improve pain-free function at home and work. Baseline:  Goal status: INITIAL   LONG TERM GOALS: Target date: 05/08/2022   1.  Pt will decrease worst back pain by at least 2 points on the NPRS in order to demonstrate clinically significant reduction in back pain. Baseline: 03/13/22: Worst 6/10 Goal status: INITIAL  2.  Pt will decrease mODI score by at least 13 points in order demonstrate clinically significant reduction in back pain/disability.       Baseline: 03/13/22: To be completed; 03/20/22: 18% Goal status: INITIAL  3.  Pt will report at least 50% improvement in her back pain in order to return to full function at home, work, and with exercise with less pain Baseline:  Goal status: INITIAL   PLAN: PT FREQUENCY: 1-2x/week  PT DURATION: 8 weeks  PLANNED INTERVENTIONS: Therapeutic exercises, Therapeutic activity, Neuromuscular re-education, Balance training, Gait training, Patient/Family education, Joint  manipulation, Joint mobilization, Canalith repositioning, Aquatic Therapy, Dry Needling, Cognitive remediation, Electrical stimulation, Spinal manipulation, Spinal mobilization, Cryotherapy, Moist heat, Traction, Ultrasound, Ionotophoresis '4mg'$ /ml Dexamethasone, and Manual therapy  PLAN FOR NEXT SESSION: Continue exercises and manual techniques, review/modify HEP as necessary;  Merdis Delay, PT, DPT, OCS  [phone]  Pincus Badder 04/10/2022, 8:05 AM  [place] [address] [place], [place], [address] Phone: [phone]   Fax:  [phone]  Patient Details  Name: Barbara Cortez MRN: [id number] Date of Birth: [birth year] Referring Provider:  Montel Culver, MD  Encounter Date: 04/10/2022   Pincus Badder, PT 04/10/2022, 8:05 AM  [facility] [address] [place], [place], [address] Phone: [phone]   Fax:  [phone]

## 2022-04-16 NOTE — Therapy (Signed)
OUTPATIENT PHYSICAL THERAPY THORACOLUMBAR TREATMENT/GOAL UPDATE   Patient Name: Barbara Cortez MRN: [id number] DOB:1956-05-07, 66 y.o., female Today's Date: 04/18/2022   PT End of Session - 04/17/22 0815     Visit Number 6    Number of Visits 17    Date for PT Re-Evaluation 05/08/22    Authorization Type eval: 03/13/22    PT Start Time 0805    PT Stop Time 0850    PT Time Calculation (min) 45 min    Activity Tolerance Patient tolerated treatment well    Behavior During Therapy [facility] for tasks assessed/performed             Past Medical History:  Diagnosis Date   Anxiety    Broken arm, left, closed, initial encounter    Chronic insomnia    COPD (chronic obstructive pulmonary disease) ([place])    Diabetes mellitus without complication ([place])    Diverticulosis    Hypertension    Impaired glucose tolerance    Internal hemorrhoids    Past Surgical History:  Procedure Laterality Date   AUGMENTATION MAMMAPLASTY Bilateral 2000   COLONOSCOPY WITH PROPOFOL N/A 01/29/2015   Procedure: COLONOSCOPY WITH PROPOFOL;  Surgeon: Lollie Sails, MD;  Location: [facility] ENDOSCOPY;  Service: Endoscopy;  Laterality: N/A;   Hysteroscopy wtih enodmetrial ablation     Saline Breast Implants     SEPTOPLASTY     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   Lymphedema of both lower extremities 10/19/2018   Body mass index (BMI) of 35.0 to 35.9 with comorbidity 07/14/2018   Lumbosacral spondylosis with radiculopathy 07/14/2018   High risk medication use 06/19/2015   Tubular adenoma of colon 01/29/2015   Diabetes mellitus type II, controlled ([place]) 06/05/2014   Chronic insomnia 02/05/2014   Hyperlipidemia associated with type 2 diabetes mellitus ([place]) 02/05/2014   Hypertension associated with type 2 diabetes mellitus ([place]) 02/05/2014   Plantar fasciitis 02/05/2014    PCP: Juline Patch, MD  REFERRING PROVIDER: Montel Culver, MD  REFERRING  DIAGNOSIS: M47.27 (ICD-10-CM) - Lumbosacral spondylosis with radiculopathy   THERAPY DIAG: Other low back pain  RATIONALE FOR EVALUATION AND TREATMENT: Rehabilitation  ONSET DATE: 03/13/2012 (approximate)  FOLLOW UP APPT WITH PROVIDER: Yes, September 2023 with Dr. Zigmund Daniel  FROM INITIAL EVALUATION  SUBJECTIVE:                                                                                                                                                                                         Chief Complaint: Low back pain   Pertinent History Insidiout onset of chronic back pain  at least 10 years ago with progressive worsening. "I can barely flip over in bed." She completed a course of physical therapy approximately 1 year ago at [facility] but did not notice significant improvement at that time. Pain worsens if she sits for a prolonged period of time and then tries to stand up (pain occurs when trying to stand not with extended sitting). Her pain improves with progressive activity  such as walking. Pt notices that first thing in the morning she gets pain radiating down anterior/anterolateral R thigh to the level of the knee but never any farther. MD ordered a lumbar MRI which is currently scheduled for next Friday 03/20/22. She has been increasing her Gabapentin as instructed with some AM somnolence noted and no improvement in pain. No history of significant knee or hip pain. She purchased a new mattress but has not noticed any change in her back. Recent lumbar radiographs showed mild levoscoliosis. No recent fracture is seen. Degenerative changes are noted with disc space narrowing, bony spurs and facet hypertrophy from L2-S1 levels.  Pain:  Pain Intensity: Present: 1/10, Best: 1/10, Worst: 6/10 Pain location: Bilateral low back, worse on the right side Pain Quality: sharp and constant Radiating: Yes  Numbness/Tingling: No Focal Weakness: No Aggravating factors: Extended sitting, first  thing in the AM, bending (yard work), rolling in bed, bending over in standing, extending standing Relieving factors: Water aerobics ([employer] for the last month 2x/wk), heating pad, has tried ice without change, Celebrex, no real benefit with Flexeril or Gabapentin,  24-hour pain behavior: Worse in the AM, improves as day progresses How long can you sit: no limitations How long can you stand: At least an hour History of prior back injury, pain, surgery, or therapy: Yes, history of PT at [facility] clinic. No prior history of injury or pain Falls: Has patient fallen in last 6 months? 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 Hobbies: water aerobics, working around the house inside and outside, currently Marine scientist   Precautions: None  [employer] Restrictions: No  Living Environment Lives with: lives alone Lives in: House/apartment, two story right side railing to second floor, 2 steps outside no rails Has following equipment at home: Grab bars in walk-in shower  Patient Goals: "I would like to be able to sleep more comfortably."   OBJECTIVE:   Patient Surveys  Modified Oswestry To be completed  FOTO 72, predicted worsening to 33   Cognition Patient is oriented to person, place, and time.  Recent memory is intact.  Remote memory is intact.  Attention span and concentration are intact.  Expressive speech is intact.  Patient's fund of knowledge is within normal limits for educational level.     Gross Musculoskeletal Assessment Tremor: None Bulk: Normal Tone: Normal No visible step-off along spinal column, no signs of scoliosis   GAIT: Full assessment deferred, no gross deficits noted entering/exiting clinic   Posture: Lumbar lordosis: Excessive lumbar lordosis noted; Iliac crest  height: Equal bilaterally Lumbar lateral shift: Negative   AROM  AROM (Normal range in degrees) AROM  04/18/2022  Lumbar   Flexion (65) Composite flexion WNL, segmental flexion is significant impaired during forward bend  Extension (30) WNL  Right lateral flexion (25) WNL  Left lateral flexion (25) 25% limitation with R low back pulling  Right rotation (30) WNL  Left  rotation (30) WNL      Hip Right Left  Flexion (125) WNL* low back pain at end range WNL  Extension (15)    Abduction (40) WNL WNL  Adduction  WNL WNL  Internal Rotation (45) >45 >45  External Rotation (45) >45 >45      Knee    Flexion (135) WNL WNL  Extension (0) WNL WNL      Ankle    Dorsiflexion (20)    Plantarflexion (50)    Inversion (35)    Eversion (15    (* = pain; Blank rows = not tested)   LE MMT:  MMT (out of 5) Right 04/18/2022 Left 04/18/2022  Hip flexion 5 5  Hip extension 3+ 3+  Hip abduction 4+ 4+  Hip adduction 4+ 4  Hip internal rotation 5 5  Hip external rotation 5 5  Knee flexion 4+ 4+  Knee extension 5 5  Ankle dorsiflexion 5 5  Ankle plantarflexion    Ankle inversion    Ankle eversion    (* = pain; Blank rows = not tested)   Sensation Grossly intact to light touch bilateral LEs as determined by testing dermatomes L2-S2. Proprioception, and hot/cold testing deferred on this date.   Reflexes Deferred   Muscle Length Hamstrings: R: Negative L: Negative   Palpation  Location LEFT  RIGHT           Lumbar paraspinals 0 0  Quadratus Lumborum 0 0  Gluteus Maximus 0 0  Gluteus Medius 1 1  Deep hip external rotators 0 0  PSIS 0 1  Fortin's Area (SIJ) 0 1  Greater Trochanter 0 0  (Blank rows = not tested) Graded on 0-4 scale (0 = no pain, 1 = pain, 2 = pain with wincing/grimacing/flinching, 3 = pain with withdrawal, 4 = unwilling to allow palpation), (Blank rows = not tested)   Passive Accessory Intervertebral Motion Pt denies reproduction of back pain with CPA  L1-L5 and UPA bilaterally L1-L5 with the exception of reproduction of low back pain with R UPA at L5. Normal mobility throughout    SPECIAL TESTS Lumbar Radiculopathy and Discogenic: Centralization and Peripheralization (SN 92, -LR 0.12): Positive for relief at home with repeated extension Slump (SN 83, -LR 0.32): R: Negative L: Negative SLR (SN 92, -LR 0.29): R: Negative L:  Negative Crossed SLR (SP 90): R: Negative L: Negative  Facet Joint: Extension-Rotation (SN 100, -LR 0.0): R: Negative L: Negative  Lumbar Foraminal Stenosis: Lumbar quadrant (SN 70): R: Negative L: Negative  Hip: FABER (SN 81): R: Negative L: Negative FADIR (SN 94): R: Positive for low back pain L: Negative Hip scour (SN 50): R: Negative L: Negative  SIJ:  Thigh Thrust (SN 88, -LR 0.18) : R: Not examined L: Not examined  Piriformis Syndrome: FAIR Test (SN 88, SP 83): R: Not examined L: Not examined  Functional Tasks Deferred  Beighton scale: History of possible hypermobility in youth  LEFT  RIGHT           1. Passive dorsiflexion and hyperextension of the fifth MCP joint beyond 90  0 0   2. Passive apposition of the thumb to the flexor aspect of the forearm  0  0   3. Passive hyperextension of the elbow beyond 10  0  0   4. Passive hyperextension of the knee beyond _0 5. Active forward flexion of the trunk with the knees fully extended so that the palms of the  hands rest flat on the floor   1   TOTAL         0/ 9     TODAY'S TREATMENT    SUBJECTIVE: Pt reports that she is doing well today. She denies any resting low back pain upon arrival but did have an increase in pain over the last week. She had a lot of stress at work and was unable to be as active. Her sleep was also more disrupted. Overall she reports approximately 20-25% improvement in symptoms since starting with therapy.    PAIN: Denies   Ther-ex  NuStep L1 x 6 minutes with moist heat pack on lumbar spine for warm-up during  interval history (3 minutes unbilled); Single knee to chest stretch x 45s each BLE; Hip FABER and FADIR stretches x 45s each BLE; Hooklying lumbar rotation rocking 2 x 60s; Hooklying anterior/posterior pelvic tilts 3s hold x 10 each; Hooklying clams with manual resistance x 10; Hooklying adductor squeeze with manual resistance ball x 10;  Hooklying bridge marches 2 x 10;   Manual Therapy  Prone CPA L1-L5, grade I-II, 30s/bout x 3 bouts/level Updated mODI: 20% Worst pain: 6/10; Percent improvement: 20-25% improved Discussed plan of care and education provided regarding relaxation and persistent pain;   Not performed: Hooklying hip fallouts with transverse abdominis contraction x 10 BLE; Long axis hip distraction throught BLE with belt x 2 minutes total; Hamstring stretch with ankle DF/PF x 30s BLE; STM to L lumbar paraspinals, L quadratus lumborum, and L glut max with theraband roller;  Hooklying SLR with transverse abdominis contraction (coordinated breath) x 10 BLE; Supine 90/90 bike with swiss ball on stomach x 10; alternate LE extension 3x6 Hooklying bridges with alternating march 2 x 10; Seated bilateral shoulder flexion with 3# dumbbells with transverse abdominis contraction (coordinated breath); Seated swiss ball forward flexion roll out stretch 10s hold/10s relax x 10;    PATIENT EDUCATION:  Education details: Pt educated throughout session about proper posture and technique with exercises. Improved exercise technique, movement at target joints, use of target muscles after min to mod verbal, visual, tactile cues.  Person educated: Patient Education method: Explanation, Demonstration, and Verbal cues Education comprehension: verbalized understanding and returned demonstration   HOME EXERCISE PROGRAM: Access Code: [email or web address] URL: [email or web address] Date: 04/18/2022 Prepared by: Roxana Hires  Exercises - Supine Pelvic Tilt  - 1 x daily - 7 x weekly - 2  sets - 10 reps - 3s hold - Hooklying Lumbar Rotation  - 1 x daily - 7 x weekly - 2 sets - 10 reps - 3s hold - Supine Hip Adduction Isometric with Ball  - 1 x daily - 7 x weekly - 2 sets - 10 reps - 3s hold - Hooklying Clamshell with Resistance  - 1 x daily - 7 x weekly - 2 sets - 10 reps - 3s hold - Supine Bridge with Resistance Band  - 1 x daily - 7 x weekly - 2 sets - 10 reps - 3s hold  Patient Education - What Is Pain? - Treating Persistent Pain Without Medications: Exercise - Treating Persistent Pain Without Medications: PT, OT, and TENS Therapy - Treating Persistent Pain Without Medications: Relaxation   ASSESSMENT:  CLINICAL IMPRESSION: Patient demonstrates excellent motivation during session today. Updated goals with patient during session today. She reports approximately 20-25% improvement in her symptoms since starting with therapy. Her pain still increases to around 6/10 at it's worst. Her mODI is 20% today which is relatively unchanged since  last update. This week was bad with respect to pain due to multiple factors that were out of patient's control. Progressed lumbar muscle stabilization retraining program today.  Pt encouraged to follow-up as scheduled. She will benefit from skilled PT to address above impairments and improve overall function.  REHAB POTENTIAL: Good  CLINICAL DECISION MAKING: Evolving/moderate complexity  EVALUATION COMPLEXITY: Moderate   GOALS: Goals reviewed with patient? No  SHORT TERM GOALS: Target date: 04/10/2022  Pt will be independent with HEP to improve strength and decrease back pain to improve pain-free function at home and work. Baseline:  Goal status: ONGOING   LONG TERM GOALS: Target date: 05/08/2022   1.  Pt will decrease worst back pain by at least 2 points on the NPRS in order to demonstrate clinically significant reduction in back pain. Baseline: 03/13/22: Worst 6/10; 04/17/22: 6/10; Goal status: INITIAL  2.  Pt will decrease mODI  score by at least 13 points in order demonstrate clinically significant reduction in back pain/disability.       Baseline: 03/13/22: To be completed; 03/20/22: 18%; 04/17/22: 20% Goal status: INITIAL  3.  Pt will report at least 50% improvement in her back pain in order to return to full function at home, work, and with exercise with less pain Baseline: 04/18/22: 20-25% improved Goal status: PARTIALLY MET   PLAN: PT FREQUENCY: 1-2x/week  PT DURATION: 8 weeks  PLANNED INTERVENTIONS: Therapeutic exercises, Therapeutic activity, Neuromuscular re-education, Balance training, Gait training, Patient/Family education, Joint manipulation, Joint mobilization, Canalith repositioning, Aquatic Therapy, Dry Needling, Cognitive remediation, Electrical stimulation, Spinal manipulation, Spinal mobilization, Cryotherapy, Moist heat, Traction, Ultrasound, Ionotophoresis 17m/ml Dexamethasone, and Manual therapy  PLAN FOR NEXT SESSION: Continue exercises and manual techniques, review/modify HEP as necessary;  JLyndel SafeHuprich PT, DPT, GCS  Mosiah Bastin 04/18/2022, 11:18 AM

## 2022-04-17 ENCOUNTER — Ambulatory Visit: Payer: Managed Care, Other (non HMO)

## 2022-04-17 DIAGNOSIS — M5459 Other low back pain: Secondary | ICD-10-CM

## 2022-04-21 ENCOUNTER — Ambulatory Visit: Payer: Managed Care, Other (non HMO)

## 2022-04-21 DIAGNOSIS — M5459 Other low back pain: Secondary | ICD-10-CM

## 2022-04-21 NOTE — Therapy (Signed)
OUTPATIENT PHYSICAL THERAPY THORACOLUMBAR TREATMENT   Patient Name: Barbara Cortez MRN: [id number] DOB:11/13/1955, 66 y.o., female Today's Date: 04/21/2022   PT End of Session - 04/21/22 1626     Visit Number 7    Number of Visits 17    Date for PT Re-Evaluation 05/08/22    Authorization Type eval: 03/13/22    PT Start Time 1616    PT Stop Time 1700    PT Time Calculation (min) 44 min    Activity Tolerance Patient tolerated treatment well    Behavior During Therapy WFL for tasks assessed/performed            Past Medical History:  Diagnosis Date   Anxiety    Broken arm, left, closed, initial encounter    Chronic insomnia    COPD (chronic obstructive pulmonary disease) ([place])    Diabetes mellitus without complication ([place])    Diverticulosis    Hypertension    Impaired glucose tolerance    Internal hemorrhoids    Past Surgical History:  Procedure Laterality Date   AUGMENTATION MAMMAPLASTY Bilateral 2000   COLONOSCOPY WITH PROPOFOL N/A 01/29/2015   Procedure: COLONOSCOPY WITH PROPOFOL;  Surgeon: Lollie Sails, MD;  Location: [facility] ENDOSCOPY;  Service: Endoscopy;  Laterality: N/A;   Hysteroscopy wtih enodmetrial ablation     Saline Breast Implants     SEPTOPLASTY     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   Lymphedema of both lower extremities 10/19/2018   Body mass index (BMI) of 35.0 to 35.9 with comorbidity 07/14/2018   Lumbosacral spondylosis with radiculopathy 07/14/2018   High risk medication use 06/19/2015   Tubular adenoma of colon 01/29/2015   Diabetes mellitus type II, controlled ([place]) 06/05/2014   Chronic insomnia 02/05/2014   Hyperlipidemia associated with type 2 diabetes mellitus ([place]) 02/05/2014   Hypertension associated with type 2 diabetes mellitus ([place]) 02/05/2014   Plantar fasciitis 02/05/2014    PCP: Juline Patch, MD  REFERRING PROVIDER: Montel Culver, MD  REFERRING DIAGNOSIS: M47.27  (ICD-10-CM) - Lumbosacral spondylosis with radiculopathy   THERAPY DIAG: Other low back pain  RATIONALE FOR EVALUATION AND TREATMENT: Rehabilitation  ONSET DATE: 03/13/2012 (approximate)  FOLLOW UP APPT WITH PROVIDER: Yes, September 2023 with Dr. Zigmund Daniel  FROM INITIAL EVALUATION  SUBJECTIVE:                                                                                                                                                                                         Chief Complaint: Low back pain   Pertinent History Insidiout onset of chronic back pain at least  10 years ago with progressive worsening. "I can barely flip over in bed." She completed a course of physical therapy approximately 1 year ago at [facility] but did not notice significant improvement at that time. Pain worsens if she sits for a prolonged period of time and then tries to stand up (pain occurs when trying to stand not with extended sitting). Her pain improves with progressive activity  such as walking. Pt notices that first thing in the morning she gets pain radiating down anterior/anterolateral R thigh to the level of the knee but never any farther. MD ordered a lumbar MRI which is currently scheduled for next Friday 03/20/22. She has been increasing her Gabapentin as instructed with some AM somnolence noted and no improvement in pain. No history of significant knee or hip pain. She purchased a new mattress but has not noticed any change in her back. Recent lumbar radiographs showed mild levoscoliosis. No recent fracture is seen. Degenerative changes are noted with disc space narrowing, bony spurs and facet hypertrophy from L2-S1 levels.  Pain:  Pain Intensity: Present: 1/10, Best: 1/10, Worst: 6/10 Pain location: Bilateral low back, worse on the right side Pain Quality: sharp and constant Radiating: Yes  Numbness/Tingling: No Focal Weakness: No Aggravating factors: Extended sitting, first thing in the AM,  bending (yard work), rolling in bed, bending over in standing, extending standing Relieving factors: Water aerobics ([employer] for the last month 2x/wk), heating pad, has tried ice without change, Celebrex, no real benefit with Flexeril or Gabapentin,  24-hour pain behavior: Worse in the AM, improves as day progresses How long can you sit: no limitations How long can you stand: At least an hour History of prior back injury, pain, surgery, or therapy: Yes, history of PT at [facility] clinic. No prior history of injury or pain Falls: Has patient fallen in last 6 months? 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 Hobbies: water aerobics, working around the house inside and outside, currently Marine scientist   Precautions: None  [employer] Restrictions: No  Living Environment Lives with: lives alone Lives in: House/apartment, two story right side railing to second floor, 2 steps outside no rails Has following equipment at home: Grab bars in walk-in shower  Patient Goals: "I would like to be able to sleep more comfortably."   OBJECTIVE:   Patient Surveys  Modified Oswestry To be completed  FOTO 72, predicted worsening to 76   Cognition Patient is oriented to person, place, and time.  Recent memory is intact.  Remote memory is intact.  Attention span and concentration are intact.  Expressive speech is intact.  Patient's fund of knowledge is within normal limits for educational level.     Gross Musculoskeletal Assessment Tremor: None Bulk: Normal Tone: Normal No visible step-off along spinal column, no signs of scoliosis   GAIT: Full assessment deferred, no gross deficits noted entering/exiting clinic   Posture: Lumbar lordosis: Excessive lumbar lordosis noted; Iliac crest height: Equal  bilaterally Lumbar lateral shift: Negative   AROM  AROM (Normal range in degrees) AROM  04/21/2022  Lumbar   Flexion (65) Composite flexion WNL, segmental flexion is significant impaired during forward bend  Extension (30) WNL  Right lateral flexion (25) WNL  Left lateral flexion (25) 25% limitation with R low back pulling  Right rotation (30) WNL  Left rotation (30)  WNL      Hip Right Left  Flexion (125) WNL* low back pain at end range WNL  Extension (15)    Abduction (40) WNL WNL  Adduction  WNL WNL  Internal Rotation (45) >45 >45  External Rotation (45) >45 >45      Knee    Flexion (135) WNL WNL  Extension (0) WNL WNL      Ankle    Dorsiflexion (20)    Plantarflexion (50)    Inversion (35)    Eversion (15    (* = pain; Blank rows = not tested)   LE MMT:  MMT (out of 5) Right 04/21/2022 Left 04/21/2022  Hip flexion 5 5  Hip extension 3+ 3+  Hip abduction 4+ 4+  Hip adduction 4+ 4  Hip internal rotation 5 5  Hip external rotation 5 5  Knee flexion 4+ 4+  Knee extension 5 5  Ankle dorsiflexion 5 5  Ankle plantarflexion    Ankle inversion    Ankle eversion    (* = pain; Blank rows = not tested)  Sensation Grossly intact to light touch bilateral LEs as determined by testing dermatomes L2-S2. Proprioception, and hot/cold testing deferred on this date.  Reflexes Deferred  Muscle Length Hamstrings: R: Negative L: Negative  Palpation  Location LEFT  RIGHT           Lumbar paraspinals 0 0  Quadratus Lumborum 0 0  Gluteus Maximus 0 0  Gluteus Medius 1 1  Deep hip external rotators 0 0  PSIS 0 1  Fortin's Area (SIJ) 0 1  Greater Trochanter 0 0  (Blank rows = not tested) Graded on 0-4 scale (0 = no pain, 1 = pain, 2 = pain with wincing/grimacing/flinching, 3 = pain with withdrawal, 4 = unwilling to allow palpation), (Blank rows = not tested)  Passive Accessory Intervertebral Motion Pt denies reproduction of back pain with CPA L1-L5 and UPA bilaterally  L1-L5 with the exception of reproduction of low back pain with R UPA at L5. Normal mobility throughout   SPECIAL TESTS Lumbar Radiculopathy and Discogenic: Centralization and Peripheralization (SN 92, -LR 0.12): Positive for relief at home with repeated extension Slump (SN 83, -LR 0.32): R: Negative L: Negative SLR (SN 92, -LR 0.29): R: Negative L:  Negative Crossed SLR (SP 90): R: Negative L: Negative  Facet Joint: Extension-Rotation (SN 100, -LR 0.0): R: Negative L: Negative  Lumbar Foraminal Stenosis: Lumbar quadrant (SN 70): R: Negative L: Negative  Hip: FABER (SN 81): R: Negative L: Negative FADIR (SN 94): R: Positive for low back pain L: Negative Hip scour (SN 50): R: Negative L: Negative  SIJ:  Thigh Thrust (SN 88, -LR 0.18) : R: Not examined L: Not examined  Piriformis Syndrome: FAIR Test (SN 88, SP 83): R: Not examined L: Not examined  Functional Tasks Deferred  Beighton scale: History of possible hypermobility in youth  LEFT  RIGHT           1. Passive dorsiflexion and hyperextension of the fifth MCP joint beyond 90  0 0   2. Passive apposition of the thumb to the flexor aspect of the forearm  0  0   3. Passive hyperextension of the elbow beyond 10  0  0   4. Passive hyperextension of the knee beyond _0 5. Active forward flexion of the trunk with the knees fully extended so that the palms of the hands rest flat on the floor  1   TOTAL         0/ 9     TODAY'S TREATMENT    SUBJECTIVE: Pt reports that she is doing well today. She reports 4/10 resting low back pain upon arrival which is a pretty normal baseline for her.  She reports improvement in her back pain compared to last week when it was really flared up. No specific questions or concerns currently.   PAIN: Denies   Ther-ex  NuStep L1 x 6 minutes with moist heat pack on lumbar spine for warm-up during interval history (3 minutes unbilled); Single knee to chest stretch x 45s each  BLE; Hooklying lumbar rotation rocking 2 x 60s; Hooklying anterior/posterior pelvic tilts 3s hold x 10 each; Hooklying marches with opposite arm lift x 10 BLE Supine 90/90 alternating heel taps x 10 BLE; Swiss ball bridges with arms at side 2 x 10; Seated marching with arm lift (holding 2# dumbbells) 2 x 10 BLE; 6" lateral heel taps with Airex pad on floor 2 x 10 BLE, cues for more weight through heels to prevent anterior knee pain; Double black tband resisted gait forward, backward, R lateral, L lateral x 5 each direction;   Not performed: Hooklying hip fallouts with transverse abdominis contraction x 10 BLE; Long axis hip distraction throught BLE with belt x 2 minutes total; Prone CPA L1-L5, grade I-II, 30s/bout x 3 bouts/level; Hip FABER and FADIR stretches x 45s each BLE; Hamstring stretch with ankle DF/PF x 30s BLE; STM to L lumbar paraspinals, L quadratus lumborum, and L glut max with theraband roller;  Hooklying SLR with transverse abdominis contraction (coordinated breath) x 10 BLE; Supine 90/90 bike with swiss ball on stomach x 10; alternate LE extension 3x6 Hooklying bridges with alternating march 2 x 10; Seated bilateral shoulder flexion with 3# dumbbells with transverse abdominis contraction (coordinated breath); Seated swiss ball forward flexion roll out stretch 10s hold/10s relax x 10; Hooklying clams with manual resistance x 10; Hooklying adductor squeeze with manual resistance ball x 10;    PATIENT EDUCATION:  Education details: Pt educated throughout session about proper posture and technique with exercises. Improved exercise technique, movement at target joints, use of target muscles after min to mod verbal, visual, tactile cues.  Person educated: Patient Education method: Explanation, Demonstration, and Verbal cues Education comprehension: verbalized understanding and returned demonstration   HOME EXERCISE PROGRAM: Access Code: [email or web address] URL:  [email or web address] Date: 04/18/2022 Prepared by: Roxana Hires  Exercises - Supine Pelvic Tilt  - 1 x daily - 7 x weekly - 2 sets - 10 reps - 3s hold - Hooklying Lumbar Rotation  - 1 x daily - 7 x weekly - 2 sets - 10 reps - 3s hold - Supine Hip Adduction Isometric with Ball  - 1 x daily - 7 x weekly - 2 sets - 10 reps - 3s hold - Hooklying Clamshell with Resistance  - 1 x daily - 7 x weekly - 2 sets - 10 reps - 3s hold - Supine Bridge with Resistance Band  - 1 x daily - 7 x weekly - 2 sets - 10 reps - 3s hold  Patient Education - What Is Pain? - Treating Persistent Pain Without Medications: Exercise - Treating Persistent Pain Without Medications: PT, OT, and TENS Therapy - Treating Persistent Pain Without Medications: Relaxation   ASSESSMENT:  CLINICAL IMPRESSION: Patient demonstrates excellent motivation during session today. Her pain is better today compared to prior therapy session. Progressed lumbar muscle stabilization retraining program  today and incorporated more seated and standing exercises. Pt denies increase in back pain. She does have some knee pain with lateral heel taps but with cues for technique is able to minimize. Pt encouraged to follow-up as scheduled. She will benefit from skilled PT to address above impairments and improve overall function.  REHAB POTENTIAL: Good  CLINICAL DECISION MAKING: Evolving/moderate complexity  EVALUATION COMPLEXITY: Moderate   GOALS: Goals reviewed with patient? No  SHORT TERM GOALS: Target date: 04/10/2022  Pt will be independent with HEP to improve strength and decrease back pain to improve pain-free function at home and work. Baseline:  Goal status: ONGOING   LONG TERM GOALS: Target date: 05/08/2022   1.  Pt will decrease worst back pain by at least 2 points on the NPRS in order to demonstrate clinically significant reduction in back pain. Baseline: 03/13/22: Worst 6/10; 04/17/22: 6/10; Goal status:  INITIAL  2.  Pt will decrease mODI score by at least 13 points in order demonstrate clinically significant reduction in back pain/disability.       Baseline: 03/13/22: To be completed; 03/20/22: 18%; 04/17/22: 20% Goal status: INITIAL  3.  Pt will report at least 50% improvement in her back pain in order to return to full function at home, work, and with exercise with less pain Baseline: 04/18/22: 20-25% improved Goal status: PARTIALLY MET   PLAN: PT FREQUENCY: 1-2x/week  PT DURATION: 8 weeks  PLANNED INTERVENTIONS: Therapeutic exercises, Therapeutic activity, Neuromuscular re-education, Balance training, Gait training, Patient/Family education, Joint manipulation, Joint mobilization, Canalith repositioning, Aquatic Therapy, Dry Needling, Cognitive remediation, Electrical stimulation, Spinal manipulation, Spinal mobilization, Cryotherapy, Moist heat, Traction, Ultrasound, Ionotophoresis 67m/ml Dexamethasone, and Manual therapy  PLAN FOR NEXT SESSION: Continue exercises and manual techniques, review/modify HEP as necessary;  JLyndel SafeHuprich PT, DPT, [facility]  Donovon Micheletti 04/21/2022, 5:22 PM

## 2022-04-22 ENCOUNTER — Other Ambulatory Visit: Payer: Self-pay | Admitting: Family Medicine

## 2022-04-22 DIAGNOSIS — E119 Type 2 diabetes mellitus without complications: Secondary | ICD-10-CM

## 2022-04-23 NOTE — Telephone Encounter (Signed)
Requested medication (s) are due for refill today:   No  Requested medication (s) are on the active medication list:   Yes  Future visit scheduled:   Yes tomorrow morning (9/22)   Last ordered: 01/22/2022 #90, 1 refill  Returned because CBC and B12 levels due per protocol.     Requested Prescriptions  Pending Prescriptions Disp Refills   metFORMIN (GLUCOPHAGE-XR) 500 MG 24 hr tablet [Pharmacy Med Name: METFORMIN ER TAB 500MG GP] 90 tablet 1    Sig: TAKE 1 TABLET DAILY     Endocrinology:  Diabetes - Biguanides Failed - 04/22/2022  1:58 PM      Failed - B12 Level in normal range and within 720 days    No results found for: "VITAMINB12"       Failed - CBC within normal limits and completed in the last 12 months    No results found for: "WBC", "WBCKUC" No results found for: "RBC", "RBCKUC" No results found for: "HGB", "HGBKUC", "HGBPOCKUC", "HGBOTHER", "TOTHGB", "HGBPLASMA", "LABHEMOF" No results found for: "HCT", "HCTKUC", "SRHCT" No results found for: "MCHC", "[place]" No results found for: "MCH", "[place]" No results found for: "MCVKUC", "MCV" No results found for: "PLTCOUNTKUC", "LABPLAT", "POCPLA" No results found for: "RDW", "RDWKUC", "POCRDW"       Passed - Cr in normal range and within 360 days    Creatinine, Ser  Date Value Ref Range Status  06/24/2021 0.76 0.57 - 1.00 mg/dL Final         Passed - HBA1C is between 0 and 7.9 and within 180 days    Hgb A1c MFr Bld  Date Value Ref Range Status  01/22/2022 6.1 (H) 4.8 - 5.6 % Final    Comment:             Prediabetes: 5.7 - 6.4          Diabetes: >6.4          Glycemic control for adults with diabetes: <7.0          Passed - eGFR in normal range and within 360 days    eGFR  Date Value Ref Range Status  06/24/2021 87 >59 mL/min/1.73 Final         Passed - Valid encounter within last 6 months    Recent Outpatient Visits           1 month ago Lumbosacral spondylosis with radiculopathy   [place] [facility] [place], Earley Abide, MD   2 months ago Lumbosacral spondylosis with radiculopathy   [place] [facility] at [place], Earley Abide, MD   2 months ago Allergic conjunctivitis of both eyes   [place] [facility] [place], [place], MD   3 months ago Hypertension associated with type 2 diabetes mellitus ([place])   [place] [facility] [place], [place], MD   7 months ago Hypertension associated with type 2 diabetes mellitus [facility])   [place] [facility] at [place], [place], MD       Future Appointments             Tomorrow Juline Patch, MD [facility] at [facility], [place]   In 3 months Juline Patch, MD [place] [facility] at [facility], [facility]

## 2022-04-24 ENCOUNTER — Ambulatory Visit: Payer: Managed Care, Other (non HMO) | Admitting: Family Medicine

## 2022-04-24 ENCOUNTER — Ambulatory Visit (INDEPENDENT_AMBULATORY_CARE_PROVIDER_SITE_OTHER): Payer: Managed Care, Other (non HMO) | Admitting: Family Medicine

## 2022-04-24 ENCOUNTER — Encounter: Payer: Self-pay | Admitting: Family Medicine

## 2022-04-24 VITALS — BP 170/88 | HR 92 | Ht 66.0 in | Wt 219.0 lb

## 2022-04-24 DIAGNOSIS — M4727 Other spondylosis with radiculopathy, lumbosacral region: Secondary | ICD-10-CM

## 2022-04-24 DIAGNOSIS — Z23 Encounter for immunization: Secondary | ICD-10-CM | POA: Diagnosis not present

## 2022-04-24 MED ORDER — CELECOXIB 100 MG PO CAPS: 100.0000 mg | ORAL_CAPSULE | Freq: Two times a day (BID) | ORAL | 0 refills | Status: DC | PRN

## 2022-04-24 NOTE — Progress Notes (Signed)
     Primary Care / Sports Medicine Office Visit  Patient Information:  Patient ID: Barbara Cortez, female DOB: 05/07/1956 Age: 66 y.o. MRN: [id number]   Barbara Cortez is a pleasant 66 y.o. female presenting with the following:  Chief Complaint  Patient presents with   Back Pain    Vitals:   04/24/22 0819  BP: (!) 170/88  Pulse: 92  SpO2: 97%   Vitals:   04/24/22 0819  Weight: 219 lb (99.3 kg)  Height: '5\' 6"'$  (1.676 m)   Body mass index is 35.35 kg/m.  No results found.   Independent interpretation of notes and tests performed by another provider:   None  Procedures performed:   None  Pertinent History, Exam, Impression, and Recommendations:   Problem List Items Addressed This Visit       Nervous and Auditory   Lumbosacral spondylosis with radiculopathy - Primary    Patient presents for follow-up to acute on chronic lumbosacral pain, over interim visit patient was titrated has been compliant with regular physical therapy and home exercises and has noted improvement.  From medication standpoint she has been dosing Celebrex once nightly with benefit, we did trial gabapentin titration from 100 mg to 300 mg however patient could not tolerate that dose and has discontinued it.  She utilizes as needed cyclobenzaprine.  We reviewed the recent lumbar spine MRI findings, her clinical progress, and next possible steps.  While she does admit to improvement in paresthesias upon awakening (the only time she is symptomatic), she continues to have stable if somewhat improved lumbosacral pain with ADLs such as prolonged standing, working in the kitchen, over sink, etc.  As such, I have advised further evaluation with the pain and spine group, referral placed today to Dr. Holley Raring, and for medication management standpoint I have advised gentle increase of Celebrex to twice daily as needed, continue physical therapy and home exercises.      Relevant Medications   celecoxib  (CELEBREX) 100 MG capsule   Other Relevant Orders   Ambulatory referral to Anesthesiology   Other Visit Diagnoses     Need for immunization against influenza       Relevant Orders   Flu Vaccine QUAD 69moIM (Fluarix, Fluzone & Alfiuria Quad PF) (Completed)        Orders & Medications Meds ordered this encounter  Medications   celecoxib (CELEBREX) 100 MG capsule    Sig: Take 1 capsule (100 mg total) by mouth 2 (two) times daily as needed.    Dispense:  180 capsule    Refill:  0   Orders Placed This Encounter  Procedures   Flu Vaccine QUAD 622moM (Fluarix, Fluzone & Alfiuria Quad PF)   Ambulatory referral to Anesthesiology     No follow-ups on file.     JaMontel CulverMD   Primary Care Sports Medicine [place]

## 2022-04-24 NOTE — Assessment & Plan Note (Signed)
Patient presents for follow-up to acute on chronic lumbosacral pain, over interim visit patient was titrated has been compliant with regular physical therapy and home exercises and has noted improvement.  From medication standpoint she has been dosing Celebrex once nightly with benefit, we did trial gabapentin titration from 100 mg to 300 mg however patient could not tolerate that dose and has discontinued it.  She utilizes as needed cyclobenzaprine.  We reviewed the recent lumbar spine MRI findings, her clinical progress, and next possible steps.  While she does admit to improvement in paresthesias upon awakening (the only time she is symptomatic), she continues to have stable if somewhat improved lumbosacral pain with ADLs such as prolonged standing, working in the kitchen, over sink, etc.  As such, I have advised further evaluation with the pain and spine group, referral placed today to Dr. Holley Raring, and for medication management standpoint I have advised gentle increase of Celebrex to twice daily as needed, continue physical therapy and home exercises.

## 2022-04-24 NOTE — Patient Instructions (Signed)
-   Continue physical therapy and home exercises - Can increase Celebrex to twice daily on an as-needed basis, consider additional dose on the days that you have physical therapy - Referral coordinator will contact you to schedule visit with pain and spine group - Contact for questions and follow-up as needed

## 2022-04-30 ENCOUNTER — Ambulatory Visit: Payer: Managed Care, Other (non HMO)

## 2022-04-30 DIAGNOSIS — M5459 Other low back pain: Secondary | ICD-10-CM

## 2022-04-30 NOTE — Therapy (Signed)
OUTPATIENT PHYSICAL THERAPY THORACOLUMBAR TREATMENT   Patient Name: Barbara Cortez MRN: [id number] DOB:[birth year], 66 y.o., female Today's Date: 05/01/2022   PT End of Session - 04/30/22 1448     Visit Number 8    Number of Visits 17    Date for PT Re-Evaluation 05/08/22    Authorization Type eval: 03/13/22    PT Start Time 1450    PT Stop Time 1530    PT Time Calculation (min) 40 min    Activity Tolerance Patient tolerated treatment well    Behavior During Therapy WFL for tasks assessed/performed            Past Medical History:  Diagnosis Date   Anxiety    Broken arm, left, closed, initial encounter    Chronic insomnia    COPD (chronic obstructive pulmonary disease) ([place])    Diabetes mellitus without complication ([place])    Diverticulosis    Hypertension    Impaired glucose tolerance    Internal hemorrhoids    Past Surgical History:  Procedure Laterality Date   AUGMENTATION MAMMAPLASTY Bilateral 2000   COLONOSCOPY WITH PROPOFOL N/A 01/29/2015   Procedure: COLONOSCOPY WITH PROPOFOL;  Surgeon: Lollie Sails, MD;  Location: [facility] ENDOSCOPY;  Service: Endoscopy;  Laterality: N/A;   Hysteroscopy wtih enodmetrial ablation     Saline Breast Implants     SEPTOPLASTY     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   Lymphedema of both lower extremities 10/19/2018   Body mass index (BMI) of 35.0 to 35.9 with comorbidity 07/14/2018   Lumbosacral spondylosis with radiculopathy 07/14/2018   High risk medication use 06/19/2015   Tubular adenoma of colon 01/29/2015   Diabetes mellitus type II, controlled ([place]) 06/05/2014   Chronic insomnia 02/05/2014   Hyperlipidemia associated with type 2 diabetes mellitus ([place]) 02/05/2014   Hypertension associated with type 2 diabetes mellitus ([place]) 02/05/2014   Plantar fasciitis 02/05/2014    PCP: Juline Patch, MD  REFERRING PROVIDER: Montel Culver, MD  REFERRING DIAGNOSIS: M47.27  (ICD-10-CM) - Lumbosacral spondylosis with radiculopathy   THERAPY DIAG: Other low back pain  RATIONALE FOR EVALUATION AND TREATMENT: Rehabilitation  ONSET DATE: 03/13/2012 (approximate)  FOLLOW UP APPT WITH PROVIDER: Yes, September 2023 with Dr. Zigmund Daniel  FROM INITIAL EVALUATION  SUBJECTIVE:                                                                                                                                                                                         Chief Complaint: Low back pain   Pertinent History Insidiout onset of chronic back pain at least  10 years ago with progressive worsening. "I can barely flip over in bed." She completed a course of physical therapy approximately 1 year ago at [facility] but did not notice significant improvement at that time. Pain worsens if she sits for a prolonged period of time and then tries to stand up (pain occurs when trying to stand not with extended sitting). Her pain improves with progressive activity  such as walking. Pt notices that first thing in the morning she gets pain radiating down anterior/anterolateral R thigh to the level of the knee but never any farther. MD ordered a lumbar MRI which is currently scheduled for next Friday 03/20/22. She has been increasing her Gabapentin as instructed with some AM somnolence noted and no improvement in pain. No history of significant knee or hip pain. She purchased a new mattress but has not noticed any change in her back. Recent lumbar radiographs showed mild levoscoliosis. No recent fracture is seen. Degenerative changes are noted with disc space narrowing, bony spurs and facet hypertrophy from L2-S1 levels.  Pain:  Pain Intensity: Present: 1/10, Best: 1/10, Worst: 6/10 Pain location: Bilateral low back, worse on the right side Pain Quality: sharp and constant Radiating: Yes  Numbness/Tingling: No Focal Weakness: No Aggravating factors: Extended sitting, first thing in the AM,  bending (yard work), rolling in bed, bending over in standing, extending standing Relieving factors: Water aerobics ([employer] for the last month 2x/wk), heating pad, has tried ice without change, Celebrex, no real benefit with Flexeril or Gabapentin,  24-hour pain behavior: Worse in the AM, improves as day progresses How long can you sit: no limitations How long can you stand: At least an hour History of prior back injury, pain, surgery, or therapy: Yes, history of PT at [facility] clinic. No prior history of injury or pain Falls: Has patient fallen in last 6 months? 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 Hobbies: water aerobics, working around the house inside and outside, currently Marine scientist   Precautions: None  [employer] Restrictions: No  Living Environment Lives with: lives alone Lives in: House/apartment, two story right side railing to second floor, 2 steps outside no rails Has following equipment at home: Grab bars in walk-in shower  Patient Goals: "I would like to be able to sleep more comfortably."   OBJECTIVE:   Patient Surveys  Modified Oswestry To be completed  FOTO 72, predicted worsening to 78   Cognition Patient is oriented to person, place, and time.  Recent memory is intact.  Remote memory is intact.  Attention span and concentration are intact.  Expressive speech is intact.  Patient's fund of knowledge is within normal limits for educational level.     Gross Musculoskeletal Assessment Tremor: None Bulk: Normal Tone: Normal No visible step-off along spinal column, no signs of scoliosis   GAIT: Full assessment deferred, no gross deficits noted entering/exiting clinic   Posture: Lumbar lordosis: Excessive lumbar lordosis noted; Iliac crest height: Equal  bilaterally Lumbar lateral shift: Negative   AROM  AROM (Normal range in degrees) AROM  05/01/2022  Lumbar   Flexion (65) Composite flexion WNL, segmental flexion is significant impaired during forward bend  Extension (30) WNL  Right lateral flexion (25) WNL  Left lateral flexion (25) 25% limitation with R low back pulling  Right rotation (30) WNL  Left rotation (30)  WNL      Hip Right Left  Flexion (125) WNL* low back pain at end range WNL  Extension (15)    Abduction (40) WNL WNL  Adduction  WNL WNL  Internal Rotation (45) >45 >45  External Rotation (45) >45 >45      Knee    Flexion (135) WNL WNL  Extension (0) WNL WNL      Ankle    Dorsiflexion (20)    Plantarflexion (50)    Inversion (35)    Eversion (15    (* = pain; Blank rows = not tested)   LE MMT:  MMT (out of 5) Right 05/01/2022 Left 05/01/2022  Hip flexion 5 5  Hip extension 3+ 3+  Hip abduction 4+ 4+  Hip adduction 4+ 4  Hip internal rotation 5 5  Hip external rotation 5 5  Knee flexion 4+ 4+  Knee extension 5 5  Ankle dorsiflexion 5 5  Ankle plantarflexion    Ankle inversion    Ankle eversion    (* = pain; Blank rows = not tested)  Sensation Grossly intact to light touch bilateral LEs as determined by testing dermatomes L2-S2. Proprioception, and hot/cold testing deferred on this date.  Reflexes Deferred  Muscle Length Hamstrings: R: Negative L: Negative  Palpation  Location LEFT  RIGHT           Lumbar paraspinals 0 0  Quadratus Lumborum 0 0  Gluteus Maximus 0 0  Gluteus Medius 1 1  Deep hip external rotators 0 0  PSIS 0 1  Fortin's Area (SIJ) 0 1  Greater Trochanter 0 0  (Blank rows = not tested) Graded on 0-4 scale (0 = no pain, 1 = pain, 2 = pain with wincing/grimacing/flinching, 3 = pain with withdrawal, 4 = unwilling to allow palpation), (Blank rows = not tested)  Passive Accessory Intervertebral Motion Pt denies reproduction of back pain with CPA L1-L5 and UPA bilaterally  L1-L5 with the exception of reproduction of low back pain with R UPA at L5. Normal mobility throughout   SPECIAL TESTS Lumbar Radiculopathy and Discogenic: Centralization and Peripheralization (SN 92, -LR 0.12): Positive for relief at home with repeated extension Slump (SN 83, -LR 0.32): R: Negative L: Negative SLR (SN 92, -LR 0.29): R: Negative L:  Negative Crossed SLR (SP 90): R: Negative L: Negative  Facet Joint: Extension-Rotation (SN 100, -LR 0.0): R: Negative L: Negative  Lumbar Foraminal Stenosis: Lumbar quadrant (SN 70): R: Negative L: Negative  Hip: FABER (SN 81): R: Negative L: Negative FADIR (SN 94): R: Positive for low back pain L: Negative Hip scour (SN 50): R: Negative L: Negative  SIJ:  Thigh Thrust (SN 88, -LR 0.18) : R: Not examined L: Not examined  Piriformis Syndrome: FAIR Test (SN 88, SP 83): R: Not examined L: Not examined  Functional Tasks Deferred  Beighton scale: History of possible hypermobility in youth  LEFT  RIGHT           1. Passive dorsiflexion and hyperextension of the fifth MCP joint beyond 90  0 0   2. Passive apposition of the thumb to the flexor aspect of the forearm  0  0   3. Passive hyperextension of the elbow beyond 10  0  0   4. Passive hyperextension of the knee beyond 10  1 1   5. Active forward flexion of the trunk with the knees fully extended so that the palms of the hands rest flat on the floor  1   TOTAL         0/ 9      TODAY'S TREATMENT    SUBJECTIVE: Pt reports that she is doing well today. She denies resting low back pain upon arrival. No significant changes since the last therapy session and no specific questions or concerns.   PAIN: Denies   Ther-ex  NuStep L1-4 x 6 minutes with moist heat pack on lumbar spine for warm-up during interval history (3 minutes unbilled); Single knee to chest stretch x 45s each BLE; Hooklying lumbar rotation rocking 2 x 60s; Hooklying bridges with alternating march 2 x  10; Sidelying hip abduction with 2# ankle weights (AW) 2 x 10 BLE; Sidelying reverse clams with 2# AW 2 x 10 BLE; Prone hamstring curls with 2# AW 2 x 10 BLE; Prone hip extension with 2# AW 2 x 10 BLE; Nautilus lat pull down 70# x 10, 80# 2 x 10;   Not performed: Hooklying hip fallouts with transverse abdominis contraction x 10 BLE; Long axis hip distraction throught BLE with belt x 2 minutes total; Prone CPA L1-L5, grade I-II, 30s/bout x 3 bouts/level; Hip FABER and FADIR stretches x 45s each BLE; Hamstring stretch with ankle DF/PF x 30s BLE; STM to L lumbar paraspinals, L quadratus lumborum, and L glut max with theraband roller;  Hooklying SLR with transverse abdominis contraction (coordinated breath) x 10 BLE; Supine 90/90 bike with swiss ball on stomach x 10; alternate LE extension 3x6 Seated bilateral shoulder flexion with 3# dumbbells with transverse abdominis contraction (coordinated breath); Seated swiss ball forward flexion roll out stretch 10s hold/10s relax x 10; Hooklying clams with manual resistance x 10; Hooklying adductor squeeze with manual resistance ball x 10;  Hooklying anterior/posterior pelvic tilts 3s hold x 10 each; Hooklying marches with opposite arm lift x 10 BLE Supine 90/90 alternating heel taps x 10 BLE; Swiss ball bridges with arms at side 2 x 10; Seated marching with arm lift (holding 2# dumbbells) 2 x 10 BLE; 6" lateral heel taps with Airex pad on floor 2 x 10 BLE, cues for more weight through heels to prevent anterior knee pain; Double black tband resisted gait forward, backward, R lateral, L lateral x 5 each direction;   PATIENT EDUCATION:  Education details: Pt educated throughout session about proper posture and technique with exercises. Improved exercise technique, movement at target joints, use of target muscles after min to mod verbal, visual, tactile cues.  Person educated: Patient Education method: Explanation, Demonstration, and Verbal  cues Education comprehension: verbalized understanding and returned demonstration   HOME EXERCISE PROGRAM: Access Code: [email or web address] URL: [email or web address] Date: 04/18/2022 Prepared by: Roxana Hires  Exercises - Supine Pelvic Tilt  - 1 x daily - 7 x weekly - 2 sets - 10 reps - 3s hold - Hooklying Lumbar Rotation  - 1 x daily - 7 x weekly - 2 sets - 10 reps - 3s hold - Supine Hip Adduction Isometric with Ball  - 1 x daily - 7 x weekly - 2 sets - 10 reps - 3s hold - Hooklying Clamshell with Resistance  - 1 x daily - 7 x weekly - 2 sets - 10 reps - 3s hold - Supine Bridge with Resistance Band  - 1 x daily - 7 x weekly - 2 sets - 10 reps - 3s hold  Patient Education - What Is Pain? - Treating Persistent Pain Without Medications: Exercise - Treating Persistent Pain Without Medications: PT, OT, and TENS Therapy -  Treating Persistent Pain Without Medications: Relaxation   ASSESSMENT:  CLINICAL IMPRESSION: Patient demonstrates excellent motivation during session today. Progressed lumbar muscle stabilization retraining program today and added additional resistance. Pt denies increase in back pain with exercises. Pt encouraged to follow-up as scheduled. She will benefit from skilled PT to address above impairments and improve overall function.  REHAB POTENTIAL: Good  CLINICAL DECISION MAKING: Evolving/moderate complexity  EVALUATION COMPLEXITY: Moderate   GOALS: Goals reviewed with patient? No  SHORT TERM GOALS: Target date: 04/10/2022  Pt will be independent with HEP to improve strength and decrease back pain to improve pain-free function at home and work. Baseline:  Goal status: ONGOING   LONG TERM GOALS: Target date: 05/08/2022   1.  Pt will decrease worst back pain by at least 2 points on the NPRS in order to demonstrate clinically significant reduction in back pain. Baseline: 03/13/22: Worst 6/10; 04/17/22: 6/10; Goal status: INITIAL  2.  Pt will decrease  mODI score by at least 13 points in order demonstrate clinically significant reduction in back pain/disability.       Baseline: 03/13/22: To be completed; 03/20/22: 18%; 04/17/22: 20% Goal status: INITIAL  3.  Pt will report at least 50% improvement in her back pain in order to return to full function at home, work, and with exercise with less pain Baseline: 04/18/22: 20-25% improved Goal status: PARTIALLY MET   PLAN: PT FREQUENCY: 1-2x/week  PT DURATION: 8 weeks  PLANNED INTERVENTIONS: Therapeutic exercises, Therapeutic activity, Neuromuscular re-education, Balance training, Gait training, Patient/Family education, Joint manipulation, Joint mobilization, Canalith repositioning, Aquatic Therapy, Dry Needling, Cognitive remediation, Electrical stimulation, Spinal manipulation, Spinal mobilization, Cryotherapy, Moist heat, Traction, Ultrasound, Ionotophoresis 61m/ml Dexamethasone, and Manual therapy  PLAN FOR NEXT SESSION: Continue exercises and manual techniques, review/modify HEP as necessary;  JLyndel SafeHuprich PT, DPT, GCS  Barbara Cortez 05/01/2022, 10:11 AM

## 2022-05-07 NOTE — Therapy (Signed)
OUTPATIENT PHYSICAL THERAPY THORACOLUMBAR TREATMENT/RECERTIFICATION  Patient Name: Trichelle Lehan MRN: [id number] DOB:Jul 17, 1956, 66 y.o., female Today's Date: 05/08/2022   PT End of Session - 05/08/22 1114     Visit Number 9    Number of Visits 21    Date for PT Re-Evaluation 06/05/22    Authorization Type eval: 3/47/42, recertification: 59/5/63    PT Start Time 1100    PT Stop Time 1135    PT Time Calculation (min) 35 min    Activity Tolerance Patient tolerated treatment well    Behavior During Therapy WFL for tasks assessed/performed            Past Medical History:  Diagnosis Date   Anxiety    Broken arm, left, closed, initial encounter    Chronic insomnia    COPD (chronic obstructive pulmonary disease) ([place])    Diabetes mellitus without complication ([place])    Diverticulosis    Hypertension    Impaired glucose tolerance    Internal hemorrhoids    Past Surgical History:  Procedure Laterality Date   AUGMENTATION MAMMAPLASTY Bilateral 2000   COLONOSCOPY WITH PROPOFOL N/A 01/29/2015   Procedure: COLONOSCOPY WITH PROPOFOL;  Surgeon: Lollie Sails, MD;  Location: [facility] ENDOSCOPY;  Service: Endoscopy;  Laterality: N/A;   Hysteroscopy wtih enodmetrial ablation     Saline Breast Implants     SEPTOPLASTY     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   Lymphedema of both lower extremities 10/19/2018   Body mass index (BMI) of 35.0 to 35.9 with comorbidity 07/14/2018   Lumbosacral spondylosis with radiculopathy 07/14/2018   High risk medication use 06/19/2015   Tubular adenoma of colon 01/29/2015   Diabetes mellitus type II, controlled ([place]) 06/05/2014   Chronic insomnia 02/05/2014   Hyperlipidemia associated with type 2 diabetes mellitus ([place]) 02/05/2014   Hypertension associated with type 2 diabetes mellitus ([place]) 02/05/2014   Plantar fasciitis 02/05/2014   PCP: Juline Patch, MD  REFERRING PROVIDER: Montel Culver,  MD  REFERRING DIAGNOSIS: M47.27 (ICD-10-CM) - Lumbosacral spondylosis with radiculopathy   THERAPY DIAG: Other low back pain - Plan: PT plan of care cert/re-cert  RATIONALE FOR EVALUATION AND TREATMENT: Rehabilitation  ONSET DATE: 03/13/2012 (approximate)  FOLLOW UP APPT WITH PROVIDER: Yes, September 2023 with Dr. Zigmund Daniel  FROM INITIAL EVALUATION  SUBJECTIVE:                                                                                                                                                                                         Chief Complaint: Low back pain   Pertinent History Insidiout  onset of chronic back pain at least 10 years ago with progressive worsening. "I can barely flip over in bed." She completed a course of physical therapy approximately 1 year ago at [facility] but did not notice significant improvement at that time. Pain worsens if she sits for a prolonged period of time and then tries to stand up (pain occurs when trying to stand not with extended sitting). Her pain improves with progressive activity  such as walking. Pt notices that first thing in the morning she gets pain radiating down anterior/anterolateral R thigh to the level of the knee but never any farther. MD ordered a lumbar MRI which is currently scheduled for next Friday 03/20/22. She has been increasing her Gabapentin as instructed with some AM somnolence noted and no improvement in pain. No history of significant knee or hip pain. She purchased a new mattress but has not noticed any change in her back. Recent lumbar radiographs showed mild levoscoliosis. No recent fracture is seen. Degenerative changes are noted with disc space narrowing, bony spurs and facet hypertrophy from L2-S1 levels.  Pain:  Pain Intensity: Present: 1/10, Best: 1/10, Worst: 6/10 Pain location: Bilateral low back, worse on the right side Pain Quality: sharp and constant Radiating: Yes  Numbness/Tingling: No Focal  Weakness: No Aggravating factors: Extended sitting, first thing in the AM, bending (yard work), rolling in bed, bending over in standing, extending standing Relieving factors: Water aerobics ([employer] [place] for the last month 2x/wk), heating pad, has tried ice without change, Celebrex, no real benefit with Flexeril or Gabapentin,  24-hour pain behavior: Worse in the AM, improves as day progresses How long can you sit: no limitations How long can you stand: At least an hour History of prior back injury, pain, surgery, or therapy: Yes, history of PT at [facility] clinic. No prior history of injury or pain Falls: Has patient fallen in last 6 months? 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 Hobbies: water aerobics, working around the house inside and outside, currently Marine scientist   Precautions: None  [employer] Restrictions: No  Living Environment Lives with: lives alone Lives in: House/apartment, two story right side railing to second floor, 2 steps outside no rails Has following equipment at home: Grab bars in walk-in shower  Patient Goals: "I would like to be able to sleep more comfortably."   OBJECTIVE:   Patient Surveys  Modified Oswestry To be completed  FOTO 72, predicted worsening to 51  Cognition Patient is oriented to person, place, and time.  Recent memory is intact.  Remote memory is intact.  Attention span and concentration are intact.  Expressive speech is intact.  Patient's fund of knowledge is within normal limits for educational level.   Gross Musculoskeletal Assessment Tremor: None Bulk: Normal Tone: Normal No visible step-off along spinal column, no signs of scoliosis  GAIT: Full assessment deferred, no gross deficits noted entering/exiting clinic  Posture: Lumbar lordosis:  Excessive lumbar lordosis noted; Iliac crest height: Equal bilaterally Lumbar lateral shift: Negative  AROM  AROM (Normal range in degrees) AROM  05/08/2022  Lumbar   Flexion (65) Composite flexion WNL, segmental flexion is significant impaired during forward bend  Extension (30) WNL  Right lateral flexion (25) WNL  Left lateral flexion (25) 25% limitation with R low back pulling  Right rotation (30) WNL  Left rotation (  30) WNL      Hip Right Left  Flexion (125) WNL* low back pain at end range WNL  Extension (15)    Abduction (40) WNL WNL  Adduction  WNL WNL  Internal Rotation (45) >45 >45  External Rotation (45) >45 >45      Knee    Flexion (135) WNL WNL  Extension (0) WNL WNL      Ankle    Dorsiflexion (20)    Plantarflexion (50)    Inversion (35)    Eversion (15    (* = pain; Blank rows = not tested)  LE MMT:  MMT (out of 5) Right 05/08/2022 Left 05/08/2022  Hip flexion 5 5  Hip extension 3+ 3+  Hip abduction 4+ 4+  Hip adduction 4+ 4  Hip internal rotation 5 5  Hip external rotation 5 5  Knee flexion 4+ 4+  Knee extension 5 5  Ankle dorsiflexion 5 5  Ankle plantarflexion    Ankle inversion    Ankle eversion    (* = pain; Blank rows = not tested)  Sensation Grossly intact to light touch bilateral LEs as determined by testing dermatomes L2-S2. Proprioception, and hot/cold testing deferred on this date.  Reflexes Deferred  Muscle Length Hamstrings: R: Negative L: Negative  Palpation  Location LEFT  RIGHT           Lumbar paraspinals 0 0  Quadratus Lumborum 0 0  Gluteus Maximus 0 0  Gluteus Medius 1 1  Deep hip external rotators 0 0  PSIS 0 1  Fortin's Area (SIJ) 0 1  Greater Trochanter 0 0  (Blank rows = not tested) Graded on 0-4 scale (0 = no pain, 1 = pain, 2 = pain with wincing/grimacing/flinching, 3 = pain with withdrawal, 4 = unwilling to allow palpation), (Blank rows = not tested)  Passive Accessory Intervertebral Motion Pt denies  reproduction of back pain with CPA L1-L5 and UPA bilaterally L1-L5 with the exception of reproduction of low back pain with R UPA at L5. Normal mobility throughout   SPECIAL TESTS Lumbar Radiculopathy and Discogenic: Centralization and Peripheralization (SN 92, -LR 0.12): Positive for relief at home with repeated extension Slump (SN 83, -LR 0.32): R: Negative L: Negative SLR (SN 92, -LR 0.29): R: Negative L:  Negative Crossed SLR (SP 90): R: Negative L: Negative  Facet Joint: Extension-Rotation (SN 100, -LR 0.0): R: Negative L: Negative  Lumbar Foraminal Stenosis: Lumbar quadrant (SN 70): R: Negative L: Negative  Hip: FABER (SN 81): R: Negative L: Negative FADIR (SN 94): R: Positive for low back pain L: Negative Hip scour (SN 50): R: Negative L: Negative  SIJ:  Thigh Thrust (SN 88, -LR 0.18) : R: Not examined L: Not examined  Piriformis Syndrome: FAIR Test (SN 88, SP 83): R: Not examined L: Not examined  Functional Tasks Deferred  Beighton scale: History of possible hypermobility in youth  LEFT  RIGHT           1. Passive dorsiflexion and hyperextension of the fifth MCP joint beyond 90  0 0   2. Passive apposition of the thumb to the flexor aspect of the forearm  0  0   3. Passive hyperextension of the elbow beyond 10  0  0   4. Passive hyperextension of the knee beyond 10  1 1   5. Active forward flexion of the trunk with the knees fully extended so that the palms of the hands rest flat on the floor  1   TOTAL         0/ 9     TODAY'S TREATMENT    SUBJECTIVE: Pt reports that she is doing well today. She reports her baseline 3/10 low back pain upon arrival. Continues to notice significant pain when rolling in bed especially first thing in the morning or at night. No significant changes since the last therapy session and no specific questions or concerns.   PAIN: 3/10 low back pain   Ther-ex  NuStep L1-3 x 5 minutes with moist heat pack on lumbar spine for warm-up  during interval history (3 minutes unbilled); Single knee to chest stretch x 45s each BLE; Hooklying lumbar rotation rocking 2 x 60s; Hooklying bridges with alternating march x 10; Seated shoulder flexion with 3# dumbbell (DB) 2 x 10; Seated shoulder abduction with 3# DB 2 x 10; Nautilus lat pull down 80# x 10, 90# 2 x 10; Nautilus seated rows 50# x 10, 60# 2 x 10;  Updated outcome measures/goals: FOTO: 70 Worst pain: 7/10 % improvement: 20-25%   Not performed: Hooklying hip fallouts with transverse abdominis contraction x 10 BLE; Long axis hip distraction throught BLE with belt x 2 minutes total; Prone CPA L1-L5, grade I-II, 30s/bout x 3 bouts/level; Hip FABER and FADIR stretches x 45s each BLE; Hamstring stretch with ankle DF/PF x 30s BLE; STM to L lumbar paraspinals, L quadratus lumborum, and L glut max with theraband roller;  Hooklying SLR with transverse abdominis contraction (coordinated breath) x 10 BLE; Supine 90/90 bike with swiss ball on stomach x 10; alternate LE extension 3x6 Seated bilateral shoulder flexion with 3# dumbbells with transverse abdominis contraction (coordinated breath); Seated swiss ball forward flexion roll out stretch 10s hold/10s relax x 10; Hooklying clams with manual resistance x 10; Hooklying adductor squeeze with manual resistance ball x 10;  Hooklying anterior/posterior pelvic tilts 3s hold x 10 each; Hooklying marches with opposite arm lift x 10 BLE Supine 90/90 alternating heel taps x 10 BLE; Swiss ball bridges with arms at side 2 x 10; Seated marching with arm lift (holding 2# dumbbells) 2 x 10 BLE; 6" lateral heel taps with Airex pad on floor 2 x 10 BLE, cues for more weight through heels to prevent anterior knee pain; Double black tband resisted gait forward, backward, R lateral, L lateral x 5 each direction; Sidelying hip abduction with 2# ankle weights (AW) 2 x 10 BLE; Sidelying reverse clams with 2# AW 2 x 10 BLE; Prone hamstring curls  with 2# AW 2 x 10 BLE; Prone hip extension with 2# AW 2 x 10 BLE;   PATIENT EDUCATION:  Education details: Pt educated throughout session about proper posture and technique with exercises. Improved exercise technique, movement at target joints, use of target muscles after min to mod verbal, visual, tactile cues.  Person educated: Patient Education method: Explanation, Demonstration, and Verbal cues Education comprehension: verbalized understanding and returned demonstration   HOME EXERCISE PROGRAM: Access Code: [email or web address] URL: [email or web address] Date: 04/18/2022 Prepared by: Roxana Hires  Exercises - Supine Pelvic Tilt  - 1 x daily - 7 x weekly - 2 sets - 10 reps - 3s hold - Hooklying Lumbar Rotation  - 1 x daily - 7 x weekly - 2 sets - 10 reps - 3s hold - Supine Hip Adduction Isometric with Ball  - 1 x daily - 7 x weekly - 2 sets - 10 reps - 3s hold - Hooklying Clamshell with Resistance  - 1 x daily -  7 x weekly - 2 sets - 10 reps - 3s hold - Supine Bridge with Resistance Band  - 1 x daily - 7 x weekly - 2 sets - 10 reps - 3s hold  Patient Education - What Is Pain? - Treating Persistent Pain Without Medications: Exercise - Treating Persistent Pain Without Medications: PT, OT, and TENS Therapy - Treating Persistent Pain Without Medications: Relaxation   ASSESSMENT:  CLINICAL IMPRESSION: Patient demonstrates excellent motivation during session today. Updated outcome measures with patient without significant change since last update. Pt does report 20-25% improvement in her symptoms with mostly full resolution of radicular pain. She would like to finish her last two scheduled appointment and therapis twill focus on home based program. Progressed lumbar muscle stabilization retraining program today during session. She denies increase in back pain with exercises. Pt encouraged to follow-up as scheduled. She will benefit from skilled PT to address above impairments and  improve overall function.  REHAB POTENTIAL: Good  CLINICAL DECISION MAKING: Evolving/moderate complexity  EVALUATION COMPLEXITY: Moderate   GOALS: Goals reviewed with patient? No  SHORT TERM GOALS: Target date: 06/05/2022   Pt will be independent with HEP to improve strength and decrease back pain to improve pain-free function at home and work. Baseline:  Goal status: ONGOING   LONG TERM GOALS: Target date: 05/22/22   1.  Pt will decrease worst back pain by at least 2 points on the NPRS in order to demonstrate clinically significant reduction in back pain. Baseline: 03/13/22: Worst 6/10; 04/17/22: 6/10; 05/08/22: 7/10 (worst in AM); Goal status: ONGOING  2.  Pt will decrease mODI score by at least 13 points in order demonstrate clinically significant reduction in back pain/disability.       Baseline: 03/13/22: To be completed; 03/20/22: 18%; 04/17/22: 20%; 05/08/22: Deferred Goal status: ONGOING  3.  Pt will report at least 50% improvement in her back pain in order to return to full function at home, work, and with exercise with less pain Baseline: 04/18/22: 20-25% improved; 05/08/22: 20-25% improved; Goal status: PARTIALLY MET   PLAN: PT FREQUENCY: 1x/week  PT DURATION: 4 weeks  PLANNED INTERVENTIONS: Therapeutic exercises, Therapeutic activity, Neuromuscular re-education, Balance training, Gait training, Patient/Family education, Joint manipulation, Joint mobilization, Canalith repositioning, Aquatic Therapy, Dry Needling, Cognitive remediation, Electrical stimulation, Spinal manipulation, Spinal mobilization, Cryotherapy, Moist heat, Traction, Ultrasound, Ionotophoresis 54m/ml Dexamethasone, and Manual therapy  PLAN FOR NEXT SESSION: Continue exercises and manual techniques, review/modify HEP as necessary;  JLyndel SafeHuprich PT, DPT, GCS  Valaree Fresquez 05/08/2022, 12:12 PM

## 2022-05-08 ENCOUNTER — Ambulatory Visit: Payer: Managed Care, Other (non HMO) | Attending: Family Medicine

## 2022-05-08 DIAGNOSIS — M5459 Other low back pain: Secondary | ICD-10-CM | POA: Insufficient documentation

## 2022-05-15 ENCOUNTER — Ambulatory Visit: Payer: Managed Care, Other (non HMO)

## 2022-05-15 DIAGNOSIS — M5459 Other low back pain: Secondary | ICD-10-CM

## 2022-05-15 NOTE — Therapy (Signed)
OUTPATIENT PHYSICAL THERAPY THORACOLUMBAR TREATMENT   Patient Name: Barbara Cortez MRN: [id number] DOB:27-Aug-1955, 66 y.o., female Today's Date: 05/15/2022   PT End of Session - 05/15/22 0802     Visit Number 10    Number of Visits 21    Date for PT Re-Evaluation 06/05/22    Authorization Type eval: 10/23/53, recertification: 73/2/20    PT Start Time 0800    PT Stop Time 0845    PT Time Calculation (min) 45 min    Activity Tolerance Patient tolerated treatment well    Behavior During Therapy [facility] for tasks assessed/performed            Past Medical History:  Diagnosis Date   Anxiety    Broken arm, left, closed, initial encounter    Chronic insomnia    COPD (chronic obstructive pulmonary disease) ([place])    Diabetes mellitus without complication ([place])    Diverticulosis    Hypertension    Impaired glucose tolerance    Internal hemorrhoids    Past Surgical History:  Procedure Laterality Date   AUGMENTATION MAMMAPLASTY Bilateral 2000   COLONOSCOPY WITH PROPOFOL N/A 01/29/2015   Procedure: COLONOSCOPY WITH PROPOFOL;  Surgeon: Lollie Sails, MD;  Location: [facility] ENDOSCOPY;  Service: Endoscopy;  Laterality: N/A;   Hysteroscopy wtih enodmetrial ablation     Saline Breast Implants     SEPTOPLASTY     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   Lymphedema of both lower extremities 10/19/2018   Body mass index (BMI) of 35.0 to 35.9 with comorbidity 07/14/2018   Lumbosacral spondylosis with radiculopathy 07/14/2018   High risk medication use 06/19/2015   Tubular adenoma of colon 01/29/2015   Diabetes mellitus type II, controlled ([place]) 06/05/2014   Chronic insomnia 02/05/2014   Hyperlipidemia associated with type 2 diabetes mellitus ([place]) 02/05/2014   Hypertension associated with type 2 diabetes mellitus ([place]) 02/05/2014   Plantar fasciitis 02/05/2014   PCP: Juline Patch, MD  REFERRING PROVIDER: Montel Culver,  MD  REFERRING DIAGNOSIS: M47.27 (ICD-10-CM) - Lumbosacral spondylosis with radiculopathy   THERAPY DIAG: Other low back pain  RATIONALE FOR EVALUATION AND TREATMENT: Rehabilitation  ONSET DATE: 03/13/2012 (approximate)  FOLLOW UP APPT WITH PROVIDER: Yes, September 2023 with Dr. Zigmund Daniel  FROM INITIAL EVALUATION  SUBJECTIVE:                                                                                                                                                                                         Chief Complaint: Low back pain   Pertinent History Insidiout onset of chronic back pain at  least 10 years ago with progressive worsening. "I can barely flip over in bed." She completed a course of physical therapy approximately 1 year ago at [facility] but did not notice significant improvement at that time. Pain worsens if she sits for a prolonged period of time and then tries to stand up (pain occurs when trying to stand not with extended sitting). Her pain improves with progressive activity  such as walking. Pt notices that first thing in the morning she gets pain radiating down anterior/anterolateral R thigh to the level of the knee but never any farther. MD ordered a lumbar MRI which is currently scheduled for next Friday 03/20/22. She has been increasing her Gabapentin as instructed with some AM somnolence noted and no improvement in pain. No history of significant knee or hip pain. She purchased a new mattress but has not noticed any change in her back. Recent lumbar radiographs showed mild levoscoliosis. No recent fracture is seen. Degenerative changes are noted with disc space narrowing, bony spurs and facet hypertrophy from L2-S1 levels.  Pain:  Pain Intensity: Present: 1/10, Best: 1/10, Worst: 6/10 Pain location: Bilateral low back, worse on the right side Pain Quality: sharp and constant Radiating: Yes  Numbness/Tingling: No Focal Weakness: No Aggravating factors: Extended  sitting, first thing in the AM, bending (yard work), rolling in bed, bending over in standing, extending standing Relieving factors: Water aerobics ([employer] for the last month 2x/wk), heating pad, has tried ice without change, Celebrex, no real benefit with Flexeril or Gabapentin,  24-hour pain behavior: Worse in the AM, improves as day progresses How long can you sit: no limitations How long can you stand: At least an hour History of prior back injury, pain, surgery, or therapy: Yes, history of PT at [facility]. No prior history of injury or pain Falls: Has patient fallen in last 6 months? 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 Hobbies: water aerobics, working around the house inside and outside, currently Marine scientist   Precautions: None  [employer] Restrictions: No  Living Environment Lives with: lives alone Lives in: House/apartment, two story right side railing to second floor, 2 steps outside no rails Has following equipment at home: Grab bars in walk-in shower  Patient Goals: "I would like to be able to sleep more comfortably."   OBJECTIVE:   Patient Surveys  Modified Oswestry To be completed  FOTO 72, predicted worsening to 52  Cognition Patient is oriented to person, place, and time.  Recent memory is intact.  Remote memory is intact.  Attention span and concentration are intact.  Expressive speech is intact.  Patient's fund of knowledge is within normal limits for educational level.   Gross Musculoskeletal Assessment Tremor: None Bulk: Normal Tone: Normal No visible step-off along spinal column, no signs of scoliosis  GAIT: Full assessment deferred, no gross deficits noted entering/exiting clinic  Posture: Lumbar lordosis: Excessive lumbar lordosis noted; Iliac crest  height: Equal bilaterally Lumbar lateral shift: Negative  AROM  AROM (Normal range in degrees) AROM  05/15/2022  Lumbar   Flexion (65) Composite flexion WNL, segmental flexion is significant impaired during forward bend  Extension (30) WNL  Right lateral flexion (25) WNL  Left lateral flexion (25) 25% limitation with R low back pulling  Right rotation (30) WNL  Left rotation (30) WNL  Hip Right Left  Flexion (125) WNL* low back pain at end range WNL  Extension (15)    Abduction (40) WNL WNL  Adduction  WNL WNL  Internal Rotation (45) >45 >45  External Rotation (45) >45 >45      Knee    Flexion (135) WNL WNL  Extension (0) WNL WNL      Ankle    Dorsiflexion (20)    Plantarflexion (50)    Inversion (35)    Eversion (15    (* = pain; Blank rows = not tested)  LE MMT:  MMT (out of 5) Right 05/15/2022 Left 05/15/2022  Hip flexion 5 5  Hip extension 3+ 3+  Hip abduction 4+ 4+  Hip adduction 4+ 4  Hip internal rotation 5 5  Hip external rotation 5 5  Knee flexion 4+ 4+  Knee extension 5 5  Ankle dorsiflexion 5 5  Ankle plantarflexion    Ankle inversion    Ankle eversion    (* = pain; Blank rows = not tested)  Sensation Grossly intact to light touch bilateral LEs as determined by testing dermatomes L2-S2. Proprioception, and hot/cold testing deferred on this date.  Reflexes Deferred  Muscle Length Hamstrings: R: Negative L: Negative  Palpation  Location LEFT  RIGHT           Lumbar paraspinals 0 0  Quadratus Lumborum 0 0  Gluteus Maximus 0 0  Gluteus Medius 1 1  Deep hip external rotators 0 0  PSIS 0 1  Fortin's Area (SIJ) 0 1  Greater Trochanter 0 0  (Blank rows = not tested) Graded on 0-4 scale (0 = no pain, 1 = pain, 2 = pain with wincing/grimacing/flinching, 3 = pain with withdrawal, 4 = unwilling to allow palpation), (Blank rows = not tested)  Passive Accessory Intervertebral Motion Pt denies reproduction of back pain with CPA L1-L5 and  UPA bilaterally L1-L5 with the exception of reproduction of low back pain with R UPA at L5. Normal mobility throughout   SPECIAL TESTS Lumbar Radiculopathy and Discogenic: Centralization and Peripheralization (SN 92, -LR 0.12): Positive for relief at home with repeated extension Slump (SN 83, -LR 0.32): R: Negative L: Negative SLR (SN 92, -LR 0.29): R: Negative L:  Negative Crossed SLR (SP 90): R: Negative L: Negative  Facet Joint: Extension-Rotation (SN 100, -LR 0.0): R: Negative L: Negative  Lumbar Foraminal Stenosis: Lumbar quadrant (SN 70): R: Negative L: Negative  Hip: FABER (SN 81): R: Negative L: Negative FADIR (SN 94): R: Positive for low back pain L: Negative Hip scour (SN 50): R: Negative L: Negative  SIJ:  Thigh Thrust (SN 88, -LR 0.18) : R: Not examined L: Not examined  Piriformis Syndrome: FAIR Test (SN 88, SP 83): R: Not examined L: Not examined  Functional Tasks Deferred  Beighton scale: History of possible hypermobility in youth  LEFT  RIGHT           1. Passive dorsiflexion and hyperextension of the fifth MCP joint beyond 90  0 0   2. Passive apposition of the thumb to the flexor aspect of the forearm  0  0   3. Passive hyperextension of the elbow beyond 10  0  0   4. Passive hyperextension of the knee beyond _0 5. Active forward flexion of the trunk with the knees fully extended so that the palms of the hands rest flat on the floor   1   TOTAL  0/ 9      TODAY'S TREATMENT   SUBJECTIVE: Pt reports that she is doing well today. She denies pain upon arrival. No significant changes since the last therapy session and no specific questions or concerns.   PAIN: Denies   Ther-ex  NuStep L1-3 x 5 minutes with moist heat pack on lumbar spine for warm-up during interval history (3 minutes unbilled); Hooklying lumbar rotation rocking x 60s; Single knee to chest stretch x 45s each BLE; Hooklying bridges with alternating march x 10; Supine  90/90 bike with swiss ball on stomach x 10 BLE; Supine dead bugs with swiss ball on stomach x 10 BLE/BUE; Swiss ball seated marches with arms across chest 2 x 10; Swiss ball (feet together) seated shoulder flexion with 3# dumbbells (DB) 2 x 10; Nautilus lat pull down 80# x 10, 95# 2 x 10; Nautilus seated rows 50# x 10, 60# 2 x 10; Weight box lifts 22.5# from floor to waist x 10, cues and education for proper lifting mechanics;   Not performed: Hooklying SLR with transverse abdominis contraction (coordinated breath) x 10 BLE; Hooklying clams with manual resistance x 10; Hooklying adductor squeeze with manual resistance ball x 10;  Hooklying anterior/posterior pelvic tilts 3s hold x 10 each; Hooklying marches with opposite arm lift x 10 BLE Sidelying hip abduction with 2# ankle weights (AW) 2 x 10 BLE; Sidelying reverse clams with 2# AW 2 x 10 BLE; Prone hamstring curls with 2# AW 2 x 10 BLE; Prone hip extension with 2# AW 2 x 10 BLE; Swiss ball bridges with arms at side 2 x 10; Seated swiss ball forward flexion roll out stretch 10s hold/10s relax x 10; 6" lateral heel taps with Airex pad on floor 2 x 10 BLE, cues for more weight through heels to prevent anterior knee pain; Double black tband resisted gait forward, backward, R lateral, L lateral x 5 each direction;   PATIENT EDUCATION:  Education details: Pt educated throughout session about proper posture and technique with exercises. Improved exercise technique, movement at target joints, use of target muscles after min to mod verbal, visual, tactile cues. Education about Scientist, forensic; Person educated: Patient Education method: Explanation, Demonstration, and Verbal cues Education comprehension: verbalized understanding and returned demonstration   HOME EXERCISE PROGRAM: Access Code: [email or web address] URL: [email or web address] Date: 04/18/2022 Prepared by: Roxana Hires  Exercises - Supine Pelvic Tilt  - 1 x daily -  7 x weekly - 2 sets - 10 reps - 3s hold - Hooklying Lumbar Rotation  - 1 x daily - 7 x weekly - 2 sets - 10 reps - 3s hold - Supine Hip Adduction Isometric with Ball  - 1 x daily - 7 x weekly - 2 sets - 10 reps - 3s hold - Hooklying Clamshell with Resistance  - 1 x daily - 7 x weekly - 2 sets - 10 reps - 3s hold - Supine Bridge with Resistance Band  - 1 x daily - 7 x weekly - 2 sets - 10 reps - 3s hold  Patient Education - What Is Pain? - Treating Persistent Pain Without Medications: Exercise - Treating Persistent Pain Without Medications: PT, OT, and TENS Therapy - Treating Persistent Pain Without Medications: Relaxation   ASSESSMENT:  CLINICAL IMPRESSION: Patient demonstrates excellent motivation during session today. Continued to progress strengthening and added weighted box lifts to work on lifting form/technique. Plan is for one more appointment at which therapist will focus on reinforcement of home based program. Pt encouraged  to follow-up as scheduled. She will benefit from skilled PT to address above impairments and improve overall function.  REHAB POTENTIAL: Good  CLINICAL DECISION MAKING: Evolving/moderate complexity  EVALUATION COMPLEXITY: Moderate   GOALS: Goals reviewed with patient? No  SHORT TERM GOALS: Target date: 06/05/2022   Pt will be independent with HEP to improve strength and decrease back pain to improve pain-free function at home and work. Baseline:  Goal status: ONGOING   LONG TERM GOALS: Target date: 05/22/22   1.  Pt will decrease worst back pain by at least 2 points on the NPRS in order to demonstrate clinically significant reduction in back pain. Baseline: 03/13/22: Worst 6/10; 04/17/22: 6/10; 05/08/22: 7/10 (worst in AM); Goal status: ONGOING  2.  Pt will decrease mODI score by at least 13 points in order demonstrate clinically significant reduction in back pain/disability.       Baseline: 03/13/22: To be completed; 03/20/22: 18%; 04/17/22: 20%;  05/08/22: Deferred Goal status: ONGOING  3.  Pt will report at least 50% improvement in her back pain in order to return to full function at home, work, and with exercise with less pain Baseline: 04/18/22: 20-25% improved; 05/08/22: 20-25% improved; Goal status: PARTIALLY MET   PLAN: PT FREQUENCY: 1x/week  PT DURATION: 4 weeks  PLANNED INTERVENTIONS: Therapeutic exercises, Therapeutic activity, Neuromuscular re-education, Balance training, Gait training, Patient/Family education, Joint manipulation, Joint mobilization, Canalith repositioning, Aquatic Therapy, Dry Needling, Cognitive remediation, Electrical stimulation, Spinal manipulation, Spinal mobilization, Cryotherapy, Moist heat, Traction, Ultrasound, Ionotophoresis 57m/ml Dexamethasone, and Manual therapy  PLAN FOR NEXT SESSION: Update goals as needed, review/modify HEP as needed, discharge    JLyndel SafeHuprich PT, DPT, GCS  Tiarna Koppen 05/15/2022, 12:26 PM

## 2022-05-22 ENCOUNTER — Ambulatory Visit: Payer: Managed Care, Other (non HMO)

## 2022-05-22 DIAGNOSIS — M5459 Other low back pain: Secondary | ICD-10-CM | POA: Diagnosis not present

## 2022-05-22 NOTE — Therapy (Signed)
OUTPATIENT PHYSICAL THERAPY THORACOLUMBAR TREATMENT/DISCHARGE SUMMARY   Patient Name: Barbara Cortez MRN: [id number] DOB:July 01, 1956, 66 y.o., female Today's Date: 05/22/2022   PT End of Session - 05/22/22 1015     Visit Number 11    Number of Visits 21    Date for PT Re-Evaluation 06/05/22    Authorization Type eval: 9/39/03, recertification: 00/9/23    PT Start Time 3007    PT Stop Time 1048    PT Time Calculation (min) 34 min    Activity Tolerance Patient tolerated treatment well    Behavior During Therapy WFL for tasks assessed/performed            Past Medical History:  Diagnosis Date   Anxiety    Broken arm, left, closed, initial encounter    Chronic insomnia    COPD (chronic obstructive pulmonary disease) ([place])    Diabetes mellitus without complication ([place])    Diverticulosis    Hypertension    Impaired glucose tolerance    Internal hemorrhoids    Past Surgical History:  Procedure Laterality Date   AUGMENTATION MAMMAPLASTY Bilateral 2000   COLONOSCOPY WITH PROPOFOL N/A 01/29/2015   Procedure: COLONOSCOPY WITH PROPOFOL;  Surgeon: Lollie Sails, MD;  Location: [facility] ENDOSCOPY;  Service: Endoscopy;  Laterality: N/A;   Hysteroscopy wtih enodmetrial ablation     Saline Breast Implants     SEPTOPLASTY     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   Lymphedema of both lower extremities 10/19/2018   Body mass index (BMI) of 35.0 to 35.9 with comorbidity 07/14/2018   Lumbosacral spondylosis with radiculopathy 07/14/2018   High risk medication use 06/19/2015   Tubular adenoma of colon 01/29/2015   Diabetes mellitus type II, controlled ([place]) 06/05/2014   Chronic insomnia 02/05/2014   Hyperlipidemia associated with type 2 diabetes mellitus ([place]) 02/05/2014   Hypertension associated with type 2 diabetes mellitus ([place]) 02/05/2014   Plantar fasciitis 02/05/2014   PCP: Juline Patch, MD  REFERRING PROVIDER: Montel Culver, MD  REFERRING DIAGNOSIS: M47.27 (ICD-10-CM) - Lumbosacral spondylosis with radiculopathy   THERAPY DIAG: Other low back pain  RATIONALE FOR EVALUATION AND TREATMENT: Rehabilitation  ONSET DATE: 03/13/2012 (approximate)  FOLLOW UP APPT WITH PROVIDER: Yes, September 2023 with Dr. Zigmund Daniel  FROM INITIAL EVALUATION  SUBJECTIVE:                                                                                                                                                                                         Chief Complaint: Low back pain   Pertinent History Insidiout onset of chronic back pain  at least 10 years ago with progressive worsening. "I can barely flip over in bed." She completed a course of physical therapy approximately 1 year ago at [facility] but did not notice significant improvement at that time. Pain worsens if she sits for a prolonged period of time and then tries to stand up (pain occurs when trying to stand not with extended sitting). Her pain improves with progressive activity  such as walking. Pt notices that first thing in the morning she gets pain radiating down anterior/anterolateral R thigh to the level of the knee but never any farther. MD ordered a lumbar MRI which is currently scheduled for next Friday 03/20/22. She has been increasing her Gabapentin as instructed with some AM somnolence noted and no improvement in pain. No history of significant knee or hip pain. She purchased a new mattress but has not noticed any change in her back. Recent lumbar radiographs showed mild levoscoliosis. No recent fracture is seen. Degenerative changes are noted with disc space narrowing, bony spurs and facet hypertrophy from L2-S1 levels.  Pain:  Pain Intensity: Present: 1/10, Best: 1/10, Worst: 6/10 Pain location: Bilateral low back, worse on the right side Pain Quality: sharp and constant Radiating: Yes  Numbness/Tingling: No Focal Weakness: No Aggravating factors:  Extended sitting, first thing in the AM, bending (yard work), rolling in bed, bending over in standing, extending standing Relieving factors: Water aerobics ([employer] for the last month 2x/wk), heating pad, has tried ice without change, Celebrex, no real benefit with Flexeril or Gabapentin,  24-hour pain behavior: Worse in the AM, improves as day progresses How long can you sit: no limitations How long can you stand: At least an hour History of prior back injury, pain, surgery, or therapy: Yes, history of PT at [facility] clinic. No prior history of injury or pain Falls: Has patient fallen in last 6 months? 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 Hobbies: water aerobics, working around the house inside and outside, currently Marine scientist   Precautions: None  [employer] Restrictions: No  Living Environment Lives with: lives alone Lives in: House/apartment, two story right side railing to second floor, 2 steps outside no rails Has following equipment at home: Grab bars in walk-in shower  Patient Goals: "I would like to be able to sleep more comfortably."   OBJECTIVE:   Patient Surveys  Modified Oswestry To be completed  FOTO 72, predicted worsening to 14  Cognition Patient is oriented to person, place, and time.  Recent memory is intact.  Remote memory is intact.  Attention span and concentration are intact.  Expressive speech is intact.  Patient's fund of knowledge is within normal limits for educational level.   Gross Musculoskeletal Assessment Tremor: None Bulk: Normal Tone: Normal No visible step-off along spinal column, no signs of scoliosis  GAIT: Full assessment deferred, no gross deficits noted entering/exiting clinic  Posture: Lumbar lordosis: Excessive lumbar lordosis  noted; Iliac crest height: Equal bilaterally Lumbar lateral shift: Negative  AROM  AROM (Normal range in degrees) AROM  05/22/2022  Lumbar   Flexion (65) Composite flexion WNL, segmental flexion is significant impaired during forward bend  Extension (30) WNL  Right lateral flexion (25) WNL  Left lateral flexion (25) 25% limitation with R low back pulling  Right rotation (30) WNL  Left rotation (30) WNL  Hip Right Left  Flexion (125) WNL* low back pain at end range WNL  Extension (15)    Abduction (40) WNL WNL  Adduction  WNL WNL  Internal Rotation (45) >45 >45  External Rotation (45) >45 >45      Knee    Flexion (135) WNL WNL  Extension (0) WNL WNL      Ankle    Dorsiflexion (20)    Plantarflexion (50)    Inversion (35)    Eversion (15    (* = pain; Blank rows = not tested)  LE MMT:  MMT (out of 5) Right 05/22/2022 Left 05/22/2022  Hip flexion 5 5  Hip extension 3+ 3+  Hip abduction 4+ 4+  Hip adduction 4+ 4  Hip internal rotation 5 5  Hip external rotation 5 5  Knee flexion 4+ 4+  Knee extension 5 5  Ankle dorsiflexion 5 5  Ankle plantarflexion    Ankle inversion    Ankle eversion    (* = pain; Blank rows = not tested)  Sensation Grossly intact to light touch bilateral LEs as determined by testing dermatomes L2-S2. Proprioception, and hot/cold testing deferred on this date.  Reflexes Deferred  Muscle Length Hamstrings: R: Negative L: Negative  Palpation  Location LEFT  RIGHT           Lumbar paraspinals 0 0  Quadratus Lumborum 0 0  Gluteus Maximus 0 0  Gluteus Medius 1 1  Deep hip external rotators 0 0  PSIS 0 1  Fortin's Area (SIJ) 0 1  Greater Trochanter 0 0  (Blank rows = not tested) Graded on 0-4 scale (0 = no pain, 1 = pain, 2 = pain with wincing/grimacing/flinching, 3 = pain with withdrawal, 4 = unwilling to allow palpation), (Blank rows = not tested)  Passive Accessory Intervertebral Motion Pt denies reproduction of back pain  with CPA L1-L5 and UPA bilaterally L1-L5 with the exception of reproduction of low back pain with R UPA at L5. Normal mobility throughout   SPECIAL TESTS Lumbar Radiculopathy and Discogenic: Centralization and Peripheralization (SN 92, -LR 0.12): Positive for relief at home with repeated extension Slump (SN 83, -LR 0.32): R: Negative L: Negative SLR (SN 92, -LR 0.29): R: Negative L:  Negative Crossed SLR (SP 90): R: Negative L: Negative  Facet Joint: Extension-Rotation (SN 100, -LR 0.0): R: Negative L: Negative  Lumbar Foraminal Stenosis: Lumbar quadrant (SN 70): R: Negative L: Negative  Hip: FABER (SN 81): R: Negative L: Negative FADIR (SN 94): R: Positive for low back pain L: Negative Hip scour (SN 50): R: Negative L: Negative  SIJ:  Thigh Thrust (SN 88, -LR 0.18) : R: Not examined L: Not examined  Piriformis Syndrome: FAIR Test (SN 88, SP 83): R: Not examined L: Not examined  Functional Tasks Deferred  Beighton scale: History of possible hypermobility in youth  LEFT  RIGHT           1. Passive dorsiflexion and hyperextension of the fifth MCP joint beyond 90  0 0   2. Passive apposition of the thumb to the flexor aspect of the forearm  0  0   3. Passive hyperextension of the elbow beyond 10  0  0   4. Passive hyperextension of the knee beyond 10  1 1   5. Active forward flexion of the trunk with the knees fully extended so that the palms of the hands rest flat on the floor   1   TOTAL  0/ 9      TODAY'S TREATMENT   SUBJECTIVE: Pt reports no pain this date. Still has worst pain in the mornings. Agreeable for PT d/c today.    PAIN: Denies   There.ex:  Nu-step L3 for 5 minutes with MHP for comfort. Discussion of subjective goals to understand outcomes during nu-step.   Review of HEP. Additional swiss ball exercises given in updated HEP. Education on maintained water aerobics in combination with HEP and gym based machine weight exercises. Pt understanding  of HEP and of machine weights at gym as she has been a long term member. Education on alternating water based program with resistance training for tissue recovery.   Supine swiss ball dead bugs: 3x10   Seated marches on swiss ball: 3x10/LE   Reviewed mODI: 18%.   PATIENT EDUCATION:  Education details: Progress towards goals. Updated HEP. More active lifestyle, adjusting varying modes of exercise for improvement in LBP and tissue recovery for strength gains.  Person educated: Patient Education method: Explanation, Demonstration, and Verbal cues Education comprehension: verbalized understanding and returned demonstration   HOME EXERCISE PROGRAM:  05/22/22  Access Code: [email or web address] URL: [email or web address] Date: 05/22/2022 Prepared by: Larna Daughters  Exercises - Supine Pelvic Tilt  - 1 x daily - 7 x weekly - 2 sets - 10 reps - 3s hold - Hooklying Lumbar Rotation  - 1 x daily - 7 x weekly - 2 sets - 10 reps - 3s hold - Supine Hip Adduction Isometric with Ball  - 1 x daily - 7 x weekly - 2 sets - 10 reps - 3s hold - Hooklying Clamshell with Resistance  - 1 x daily - 7 x weekly - 2 sets - 10 reps - 3s hold - Supine Bridge with Resistance Band  - 1 x daily - 7 x weekly - 2 sets - 10 reps - 3s hold - Swiss Ball March  - 1 x daily - 7 x weekly - 3 sets - 10 reps - Dead Bug with [employer]  - 1 x daily - 7 x weekly - 3 sets - 10 reps  Patient Education - What Is Pain? - Treating Persistent Pain Without Medications: Exercise - Treating Persistent Pain Without Medications: PT, OT, and TENS Therapy - Treating Persistent Pain Without Medications: Relaxation    Access Code: [email or web address] URL: [email or web address] Date: 04/18/2022 Prepared by: Roxana Hires  Exercises - Supine Pelvic Tilt  - 1 x daily - 7 x weekly - 2 sets - 10 reps - 3s hold - Hooklying Lumbar Rotation  - 1 x daily - 7 x weekly - 2 sets - 10 reps - 3s hold - Supine Hip Adduction Isometric with  Ball  - 1 x daily - 7 x weekly - 2 sets - 10 reps - 3s hold - Hooklying Clamshell with Resistance  - 1 x daily - 7 x weekly - 2 sets - 10 reps - 3s hold - Supine Bridge with Resistance Band  - 1 x daily - 7 x weekly - 2 sets - 10 reps - 3s hold  Patient Education - What Is Pain? - Treating Persistent Pain Without Medications: Exercise - Treating Persistent Pain Without Medications: PT, OT, and TENS Therapy - Treating Persistent Pain Without Medications: Relaxation   ASSESSMENT:  CLINICAL IMPRESSION: Pt agreeable to PT d/c per primary therapist. Pt has made little improvement towards short term and long term specific PT goals. However pt overall endorses significant reduction in RLE sciatic symptoms,  reduction in  LBP. Understanding pain may always be present, primarily after periods of being sedentary but pt reports ability to control pain levels and overall is greatly improved compared to prior PT. Pt mODI unchanged, worst pain still elevated but less frequent and primarily in mornings. Updated HEP with progressive core stability exercise with education on varying modes of exercise with water aerobics and HEP/gym based program with rest days to encourage tissue healing. Pt understanding of education and discharge plan. Pt discharged for LBP this date.   REHAB POTENTIAL: Good  CLINICAL DECISION MAKING: Evolving/moderate complexity  EVALUATION COMPLEXITY: Moderate   GOALS: Goals reviewed with patient? No  SHORT TERM GOALS: Target date: 06/05/2022   Pt will be independent with HEP to improve strength and decrease back pain to improve pain-free function at home and work. Baseline: 05/22/22: Inconsistent compliance. Been compliant more so with water aerobics.  Goal status: Partially met   LONG TERM GOALS: Target date: 05/22/22   1.  Pt will decrease worst back pain by at least 2 points on the NPRS in order to demonstrate clinically significant reduction in back pain. Baseline:  03/13/22: Worst 6/10; 04/17/22: 6/10; 05/08/22: 7/10 (worst in AM); 6-7/10 NPS still in the morning. No pain once she gets up in the mornings.  Goal status: Partially Met  2.  Pt will decrease mODI score by at least 13 points in order demonstrate clinically significant reduction in back pain/disability.       Baseline: 03/13/22: To be completed; 03/20/22: 18%; 04/17/22: 20%; 05/08/22: Deferred; 05/22/22: 18%.  Goal status: ON GOING  3.  Pt will report at least 50% improvement in her back pain in order to return to full function at home, work, and with exercise with less pain Baseline: 04/18/22: 20-25% improved; 05/08/22: 20-25% improved; 05/22/22: 50% improved.  Goal status: ACHIEVED   PLAN: PT FREQUENCY: 1x/week  PT DURATION: 4 weeks  PLANNED INTERVENTIONS: Therapeutic exercises, Therapeutic activity, Neuromuscular re-education, Balance training, Gait training, Patient/Family education, Joint manipulation, Joint mobilization, Canalith repositioning, Aquatic Therapy, Dry Needling, Cognitive remediation, Electrical stimulation, Spinal manipulation, Spinal mobilization, Cryotherapy, Moist heat, Traction, Ultrasound, Ionotophoresis 23m/ml Dexamethasone, and Manual therapy  PLAN FOR NEXT SESSION: d/c'd  MSalem Caster Fairly IV, PT, DPT Physical Therapist- [facility] 05/22/2022, 11:01 AM

## 2022-05-27 ENCOUNTER — Other Ambulatory Visit: Payer: Self-pay | Admitting: Family Medicine

## 2022-05-30 ENCOUNTER — Other Ambulatory Visit: Payer: Self-pay | Admitting: Family Medicine

## 2022-05-30 DIAGNOSIS — M4727 Other spondylosis with radiculopathy, lumbosacral region: Secondary | ICD-10-CM

## 2022-06-01 NOTE — Telephone Encounter (Signed)
Unable to refill per protocol, Rx expired. Medication was discontinued by PCP 04/24/22. Will refuse.  Requested Prescriptions  Pending Prescriptions Disp Refills  . gabapentin (NEURONTIN) 300 MG capsule [Pharmacy Med Name: GABAPENTIN '300MG'$  CAPSULES] 90 capsule 0    Sig: TAKE 1 CAPSULE(300 MG) BY MOUTH AT BEDTIME     Neurology: Anticonvulsants - gabapentin Passed - 05/30/2022  3:35 AM      Passed - Cr in normal range and within 360 days    Creatinine, Ser  Date Value Ref Range Status  06/24/2021 0.76 0.57 - 1.00 mg/dL Final         Passed - Completed PHQ-2 or PHQ-9 in the last 360 days      Passed - Valid encounter within last 12 months    Recent Outpatient Visits          1 month ago Lumbosacral spondylosis with radiculopathy   [facility] [place], Earley Abide, MD   2 months ago Lumbosacral spondylosis with radiculopathy   [facility] [place], Earley Abide, MD   3 months ago Lumbosacral spondylosis with radiculopathy   [facility] [place], Earley Abide, MD   4 months ago Allergic conjunctivitis of both eyes   [facility] [place], [place], MD   4 months ago Hypertension associated with type 2 diabetes mellitus [facility])   [facility] [place], [place], MD      Future Appointments            In 1 month Juline Patch, MD [facility] at [facility], [facility]

## 2022-06-29 ENCOUNTER — Other Ambulatory Visit: Payer: Self-pay

## 2022-06-29 MED ORDER — MONTELUKAST SODIUM 10 MG PO TABS: ORAL_TABLET | ORAL | 0 refills | Status: DC

## 2022-07-09 ENCOUNTER — Other Ambulatory Visit: Payer: Self-pay | Admitting: Family Medicine

## 2022-07-09 DIAGNOSIS — J4520 Mild intermittent asthma, uncomplicated: Secondary | ICD-10-CM

## 2022-07-09 NOTE — Telephone Encounter (Signed)
Requested Prescriptions  Pending Prescriptions Disp Refills   albuterol (VENTOLIN HFA) 108 (90 Base) MCG/ACT inhaler [Pharmacy Med Name: ALBUTEROL PA INH HFA 200] 18 g 0    Sig: USE 2 INHALATIONS ORALLY   EVERY 4 HOURS AS NEEDED     Pulmonology:  Beta Agonists 2 Failed - 07/09/2022  2:07 AM      Failed - Last BP in normal range    BP Readings from Last 1 Encounters:  04/24/22 (!) 170/88         Passed - Last Heart Rate in normal range    Pulse Readings from Last 1 Encounters:  04/24/22 92         Passed - Valid encounter within last 12 months    Recent Outpatient Visits           2 months ago Lumbosacral spondylosis with radiculopathy   [facility] [place], Earley Abide, MD   4 months ago Lumbosacral spondylosis with radiculopathy   [facility] [place], Earley Abide, MD   5 months ago Lumbosacral spondylosis with radiculopathy   [facility] [place], Earley Abide, MD   5 months ago Allergic conjunctivitis of both eyes   [facility] [place], [place], MD   5 months ago Hypertension associated with type 2 diabetes mellitus [facility])   [place] [place], [place], MD       Future Appointments             In 2 weeks Juline Patch, MD [place] [facility] at [facility], [facility]

## 2022-07-20 ENCOUNTER — Ambulatory Visit (INDEPENDENT_AMBULATORY_CARE_PROVIDER_SITE_OTHER): Payer: Managed Care, Other (non HMO) | Admitting: Family Medicine

## 2022-07-20 ENCOUNTER — Encounter: Payer: Self-pay | Admitting: Family Medicine

## 2022-07-20 ENCOUNTER — Ambulatory Visit: Payer: Self-pay

## 2022-07-20 DIAGNOSIS — U071 COVID-19: Secondary | ICD-10-CM | POA: Diagnosis not present

## 2022-07-20 MED ORDER — MOLNUPIRAVIR EUA 200MG CAPSULE: 4.0000 | ORAL_CAPSULE | Freq: Two times a day (BID) | ORAL | 0 refills | Status: AC

## 2022-07-20 NOTE — Patient Instructions (Signed)

## 2022-07-20 NOTE — Progress Notes (Signed)
Date:  07/20/2022   Name:  Barbara Cortez   DOB:  1955-09-11   MRN:  [id number]   Chief Complaint: Covid Positive (Tested positive for covid yesterday. Headache, chills, congestion, cough. No SOB.)  I Dr. Otilio Miu from my office connected with this patient, Barbara Cortez, by telephone at the patient's home.  I verified that I am speaking with the correct person using two identifiers. This visit was conducted via telephone due to the Covid-19 outbreak from my office at [facility] in [place], [place]. I discussed the limitations, risks, security and privacy concerns of performing an evaluation and management service by telephone. I also discussed with the patient that there may be a patient responsible charge related to this service. The patient expressed understanding and agreed to proceed.    Fever  This is a new (low grade) problem. Associated symptoms include congestion, coughing, headaches and muscle aches. Pertinent negatives include no sore throat or wheezing.    Lab Results  Component Value Date   NA 143 06/24/2021   K 4.3 06/24/2021   CO2 28 06/24/2021   GLUCOSE 97 06/24/2021   BUN 11 06/24/2021   CREATININE 0.76 06/24/2021   CALCIUM 10.2 06/24/2021   EGFR 87 06/24/2021   Lab Results  Component Value Date   CHOL 208 (H) 06/24/2021   HDL 60 06/24/2021   LDLCALC 125 (H) 06/24/2021   TRIG 132 06/24/2021   Lab Results  Component Value Date   TSH 1.650 06/24/2021   Lab Results  Component Value Date   HGBA1C 6.1 (H) 01/22/2022   No results found for: "WBC", "HGB", "HCT", "MCV", "PLT" No results found for: "ALT", "AST", "GGT", "ALKPHOS", "BILITOT" No results found for: "25OHVITD2", "25OHVITD3", "VD25OH"   Review of Systems  Constitutional:  Positive for fever.  HENT:  Positive for congestion. Negative for sinus pressure and sore throat.   Respiratory:  Positive for cough. Negative for chest tightness, shortness of breath and wheezing.    Cardiovascular:  Negative for palpitations.  Neurological:  Positive for headaches. Negative for light-headedness.  Psychiatric/Behavioral:  Negative for confusion.     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   Lymphedema of both lower extremities 10/19/2018   Body mass index (BMI) of 35.0 to 35.9 with comorbidity 07/14/2018   Lumbosacral spondylosis with radiculopathy 07/14/2018   High risk medication use 06/19/2015   Tubular adenoma of colon 01/29/2015   Diabetes mellitus type II, controlled ([place]) 06/05/2014   Chronic insomnia 02/05/2014   Hyperlipidemia associated with type 2 diabetes mellitus ([place]) 02/05/2014   Hypertension associated with type 2 diabetes mellitus ([place]) 02/05/2014   Plantar fasciitis 02/05/2014    Allergies  Allergen Reactions   Ace Inhibitors Cough   Hydrochlorothiazide Itching    Photosensitivity    Past Surgical History:  Procedure Laterality Date   AUGMENTATION MAMMAPLASTY Bilateral 2000   COLONOSCOPY WITH PROPOFOL N/A 01/29/2015   Procedure: COLONOSCOPY WITH PROPOFOL;  Surgeon: Lollie Sails, MD;  Location: [facility] END[facility]COPY;  Service: Endoscopy;  Laterality: N/A;   Hysteroscopy wtih enodmetrial ablation     Saline Breast Implants     SEPTOPLASTY      Social History   Tobacco Use   Smoking status: Some Days    Packs/day: 0.25    Years: 30.00    Total pack years: 7.50    Types: Cigarettes   Smokeless tobacco: Never  Vaping Use   Vaping Use: Never used  Substance Use Topics   Alcohol use: Yes    Alcohol/week: 3.0 standard drinks of alcohol    Types: 3 Glasses of wine per week    Comment: occ   Drug use: Never     Medication list has been reviewed and updated.  Current Meds  Medication Sig   albuterol (VENTOLIN HFA) 108 (90 Base) MCG/ACT inhaler USE 2 INHALATIONS ORALLY   EVERY 4 HOURS AS NEEDED   amLODipine (NORVASC) 5 MG tablet Take 1 tablet (5 mg total) by mouth daily.   ascorbic acid  (VITAMIN C) 500 MG tablet Take 500 mg by mouth daily.   Calcium Carb-Cholecalciferol (CALCIUM CARBONATE-VITAMIN D3) 600-400 MG-UNIT TABS Take 1 tablet by mouth daily.   celecoxib (CELEBREX) 100 MG capsule Take 1 capsule (100 mg total) by mouth 2 (two) times daily as needed.   cyclobenzaprine (FLEXERIL) 10 MG tablet Take 1 tablet (10 mg total) by mouth at bedtime as needed for muscle spasms.   furosemide (LASIX) 40 MG tablet Take 1 tablet (40 mg total) by mouth daily.   irbesartan (AVAPRO) 300 MG tablet Take 1 tablet (300 mg total) by mouth daily.   metFORMIN (GLUCOPHAGE-XR) 500 MG 24 hr tablet Take 1 tablet (500 mg total) by mouth daily.   metoprolol (TOPROL-XL) 200 MG 24 hr tablet Take 1 tablet (200 mg total) by mouth daily.   montelukast (SINGULAIR) 10 MG tablet TAKE 1 TABLET(10 MG) BY MOUTH AT BEDTIME   Omega-3 Fatty Acids (FISH OIL) 1000 MG CAPS Take 2 capsules by mouth daily.   Semaglutide,0.25 or 0.5MG/DOS, (OZEMPIC, 0.25 OR 0.5 MG/DOSE,) 2 MG/1.5ML SOPN Inject 0.5 mg into the skin once a week.   simvastatin (ZOCOR) 20 MG tablet Take 1 tablet (20 mg total) by mouth daily.   Spacer/Aero-Holding Chambers (AEROCHAMBER MV) inhaler Use as instructed   traZODone (DESYREL) 50 MG tablet Take 50 mg by mouth at bedtime.       07/20/2022    2:17 PM 04/24/2022    9:03 AM 03/06/2022    8:48 AM 02/06/2022    9:44 AM  GAD 7 : Generalized Anxiety Score  Nervous, Anxious, on Edge 0 0 0 0  Control/stop worrying 0 0 0 0  Worry too much - different things 0 0 0 0  Trouble relaxing 0 0 0 0  Restless 0 0 0 0  Easily annoyed or irritable 0 0 0 0  Afraid - awful might happen 0 0 0 0  Total GAD 7 Score 0 0 0 0  Anxiety Difficulty Not difficult at all Not difficult at all Not difficult at all Not difficult at all       07/20/2022    2:17 PM 04/24/2022    9:03 AM 03/06/2022    8:48 AM  Depression screen PHQ 2/9  Decreased Interest 0 0 0  Down, Depressed, Hopeless 0 0 0  PHQ - 2 Score 0 0 0  Altered  sleeping 0 0 0  Tired, decreased energy 0 0 0  Change in appetite 0 0 0  Feeling bad or failure about yourself  0 0 0  Trouble concentrating 0 0 0  Moving slowly or fidgety/restless 0 0 0  Suicidal thoughts 0 0 0  PHQ-9 Score 0 0 0  Difficult doing work/chores Not difficult at all Not difficult at all Not difficult at all    BP Readings from Last 3 Encounters:  04/24/22 (!) 170/88  03/06/22 (!) 138/90  02/06/22 138/84    Physical Exam  Nursing note reviewed.  Neurological:     Mental Status: She is alert.     Wt Readings from Last 3 Encounters:  04/24/22 219 lb (99.3 kg)  03/06/22 216 lb (98 kg)  02/06/22 218 lb (98.9 kg)    There were no vitals taken for this visit.  Assessment and Plan:  1. COVID New onset.  Persistent.  Onset of symptoms yesterday with cough congestion myalgias.  Patient with COVID testing last night this morning which noted to be positive.  Symptoms were discussed with patient as well as possible adverse outcomes as to be aware of.  I discussed with her taking Mucinex DM for the cough as well as Tylenol for muscle aches she is already on Celebrex.  We will initiate molnupiravir 200 mg 4 capsules twice a day for 5 days.  Patient has been told that she needs to isolate for 5 days and then if asymptomatic may leave the house with a mask in place for the next 5 days.  Patient's been encouraged to hydrate well and to call if there is any further development of symptoms or concerns of worsening. - molnupiravir EUA (LAGEVRIO) 200 mg CAPS capsule; Take 4 capsules (800 mg total) by mouth 2 (two) times daily for 5 days.  Dispense: 40 capsule; Refill: 0   I spent 12 minutes with this patient, More than 50% of that time was spent in face to face education, counseling and care coordination.  Otilio Miu, MD

## 2022-07-20 NOTE — Telephone Encounter (Signed)
  Chief Complaint: COVID  Symptoms: body aches, HA, chills, cough/congestion, fatigue  Frequency: yesterday Pertinent Negatives: Patient denies SOB or fever Disposition: '[]'$ ED /'[]'$ Urgent Care (no appt availability in office) / '[x]'$ Appointment(In office/virtual)/ '[]'$  [facility]/ '[]'$ Home Care/ '[]'$ Refused Recommended Disposition /'[]'$ [facility]/ '[]'$  Follow-up with PCP Additional Notes: pt states she woke up yesterday morning feeling awful and tested positive for COVID. Pt requesting Paxlovid. Advised appt and scheduled telephone OV at 1620 today with PCP. Care advise given and pt verbalized understanding.   Summary: covid and paxlovid   Tested positive for covid yesterday / symptoms started yesterday / has body aches, congestion, chills, headaches and asked if she can be prescribed paxlovid / please advise         Reason for Disposition  [1] HIGH RISK patient (e.g., weak immune system, age > 39 years, obesity with BMI 30 or higher, pregnant, chronic lung disease or other chronic medical condition) AND [2] COVID symptoms (e.g., cough, fever)  (Exceptions: Already seen by PCP and no new or worsening symptoms.)  Answer Assessment - Initial Assessment Questions 1. COVID-19 DIAGNOSIS: "How do you know that you have COVID?" (e.g., positive lab test or self-test, diagnosed by doctor or NP/PA, symptoms after exposure).     Home test yesterday  3. ONSET: "When did the COVID-19 symptoms start?"      Yesterday  5. COUGH: "Do you have a cough?" If Yes, ask: "How bad is the cough?"       Yes, mild  6. FEVER: "Do you have a fever?" If Yes, ask: "What is your temperature, how was it measured, and when did it start?"     no 7. RESPIRATORY STATUS: "Describe your breathing?" (e.g., normal; shortness of breath, wheezing, unable to speak)      Normal  9. OTHER SYMPTOMS: "Do you have any other symptoms?"  (e.g., chills, fatigue, headache, loss of smell or taste, muscle pain, sore throat)      Fatigue, HA, body aches, chills, congestion 10. HIGH RISK DISEASE: "Do you have any chronic medical problems?" (e.g., asthma, heart or lung disease, weak immune system, obesity, etc.)      HTN 11. VACCINE: "Have you had the COVID-19 vaccine?" If Yes, ask: "Which one, how many shots, when did you get it?"       yes  Protocols used: Coronavirus (COVID-19) Diagnosed or Suspected-A-AH

## 2022-07-24 ENCOUNTER — Encounter: Payer: Self-pay | Admitting: Family Medicine

## 2022-07-24 ENCOUNTER — Ambulatory Visit (INDEPENDENT_AMBULATORY_CARE_PROVIDER_SITE_OTHER): Payer: Managed Care, Other (non HMO) | Admitting: Family Medicine

## 2022-07-24 VITALS — BP 126/86 | HR 72 | Ht 66.0 in | Wt 218.0 lb

## 2022-07-24 DIAGNOSIS — E1169 Type 2 diabetes mellitus with other specified complication: Secondary | ICD-10-CM

## 2022-07-24 DIAGNOSIS — Z780 Asymptomatic menopausal state: Secondary | ICD-10-CM

## 2022-07-24 DIAGNOSIS — E1159 Type 2 diabetes mellitus with other circulatory complications: Secondary | ICD-10-CM | POA: Diagnosis not present

## 2022-07-24 DIAGNOSIS — E119 Type 2 diabetes mellitus without complications: Secondary | ICD-10-CM

## 2022-07-24 DIAGNOSIS — I152 Hypertension secondary to endocrine disorders: Secondary | ICD-10-CM

## 2022-07-24 DIAGNOSIS — E785 Hyperlipidemia, unspecified: Secondary | ICD-10-CM

## 2022-07-24 MED ORDER — METOPROLOL SUCCINATE ER 200 MG PO TB24: 200.0000 mg | ORAL_TABLET | Freq: Every day | ORAL | 1 refills | Status: DC

## 2022-07-24 MED ORDER — IRBESARTAN 300 MG PO TABS: 300.0000 mg | ORAL_TABLET | Freq: Every day | ORAL | 1 refills | Status: DC

## 2022-07-24 MED ORDER — MONTELUKAST SODIUM 10 MG PO TABS: ORAL_TABLET | ORAL | 1 refills | Status: DC

## 2022-07-24 MED ORDER — FUROSEMIDE 40 MG PO TABS: 40.0000 mg | ORAL_TABLET | Freq: Every day | ORAL | 1 refills | Status: DC

## 2022-07-24 MED ORDER — METFORMIN HCL ER 500 MG PO TB24: 500.0000 mg | ORAL_TABLET | Freq: Every day | ORAL | 1 refills | Status: DC

## 2022-07-24 MED ORDER — OZEMPIC (0.25 OR 0.5 MG/DOSE) 2 MG/1.5ML ~~LOC~~ SOPN: 0.5000 mg | PEN_INJECTOR | SUBCUTANEOUS | 1 refills | Status: DC

## 2022-07-24 MED ORDER — AMLODIPINE BESYLATE 5 MG PO TABS: 5.0000 mg | ORAL_TABLET | Freq: Every day | ORAL | 1 refills | Status: DC

## 2022-07-24 MED ORDER — SIMVASTATIN 20 MG PO TABS: 20.0000 mg | ORAL_TABLET | Freq: Every day | ORAL | 1 refills | Status: DC

## 2022-07-24 NOTE — Progress Notes (Signed)
Date:  07/24/2022   Name:  Barbara Cortez   DOB:  [birth year]   MRN:  [id number]   Chief Complaint: Hypertension, Hyperlipidemia, Diabetes, and Leg Swelling (Takes furosemide for this)  Hypertension This is a chronic problem. The current episode started more than 1 year ago. The problem has been gradually improving since onset. The problem is controlled. Pertinent negatives include no anxiety, blurred vision, chest pain, headaches, malaise/fatigue, neck pain, orthopnea, palpitations, peripheral edema, PND, shortness of breath or sweats. There are no associated agents to hypertension. Risk factors for coronary artery disease include dyslipidemia. Past treatments include diuretics and angiotensin blockers. The current treatment provides moderate improvement. There are no compliance problems.  There is no history of angina, kidney disease, CAD/MI, CVA, heart failure, left ventricular hypertrophy, PVD or retinopathy. There is no history of chronic renal disease, a hypertension causing med or renovascular disease.  Hyperlipidemia This is a chronic problem. The current episode started more than 1 year ago. The problem is controlled. Recent lipid tests were reviewed and are normal. She has no history of chronic renal disease. Pertinent negatives include no chest pain, focal sensory loss, focal weakness, leg pain, myalgias or shortness of breath. Current antihyperlipidemic treatment includes statins. The current treatment provides moderate improvement of lipids. There are no compliance problems.  Risk factors for coronary artery disease include hypertension and dyslipidemia.  Diabetes She presents for her follow-up diabetic visit. She has type 2 diabetes mellitus. Her disease course has been improving. Pertinent negatives for hypoglycemia include no headaches, hunger, seizures or sweats. Pertinent negatives for diabetes include no blurred vision and no chest pain. Pertinent negatives for diabetic  complications include no CVA, peripheral neuropathy, PVD or retinopathy. Risk factors for coronary artery disease include dyslipidemia. Current diabetic treatments: ozempic. She is following a generally healthy diet. Meal planning includes avoidance of concentrated sweets and carbohydrate counting. She participates in exercise intermittently. An ACE inhibitor/angiotensin II receptor blocker is being taken.    Lab Results  Component Value Date   NA 143 06/24/2021   K 4.3 06/24/2021   CO2 28 06/24/2021   GLUCOSE 97 06/24/2021   BUN 11 06/24/2021   CREATININE 0.76 06/24/2021   CALCIUM 10.2 06/24/2021   EGFR 87 06/24/2021   Lab Results  Component Value Date   CHOL 208 (H) 06/24/2021   HDL 60 06/24/2021   LDLCALC 125 (H) 06/24/2021   TRIG 132 06/24/2021   Lab Results  Component Value Date   TSH 1.650 06/24/2021   Lab Results  Component Value Date   HGBA1C 6.1 (H) 01/22/2022   No results found for: "WBC", "HGB", "HCT", "MCV", "PLT" No results found for: "ALT", "AST", "GGT", "ALKPHOS", "BILITOT" No results found for: "25OHVITD2", "25OHVITD3", "VD25OH"   Review of Systems  Constitutional:  Negative for malaise/fatigue.  Eyes:  Negative for blurred vision.  Respiratory:  Negative for shortness of breath.   Cardiovascular:  Negative for chest pain, palpitations, orthopnea and PND.  Musculoskeletal:  Negative for myalgias and neck pain.  Neurological:  Negative for focal weakness, seizures and headaches.    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   Lymphedema of both lower extremities 10/19/2018   Body mass index (BMI) of 35.0 to 35.9 with comorbidity 07/14/2018   Lumbosacral spondylosis with radiculopathy 07/14/2018   High risk medication use 06/19/2015   Tubular adenoma of colon 01/29/2015   Diabetes mellitus type II, controlled ([place]) 06/05/2014   Chronic insomnia  02/05/2014   Hyperlipidemia associated with type 2 diabetes mellitus  ([place]) 02/05/2014   Hypertension associated with type 2 diabetes mellitus ([place]) 02/05/2014   Plantar fasciitis 02/05/2014    Allergies  Allergen Reactions   Ace Inhibitors Cough   Hydrochlorothiazide Itching    Photosensitivity    Past Surgical History:  Procedure Laterality Date   AUGMENTATION MAMMAPLASTY Bilateral 2000   COLONOSCOPY WITH PROPOFOL N/A 01/29/2015   Procedure: COLONOSCOPY WITH PROPOFOL;  Surgeon: Lollie Sails, MD;  Location: [facility] ENDOSCOPY;  Service: Endoscopy;  Laterality: N/A;   Hysteroscopy wtih enodmetrial ablation     Saline Breast Implants     SEPTOPLASTY      Social History   Tobacco Use   Smoking status: Some Days    Packs/day: 0.25    Years: 30.00    Total pack years: 7.50    Types: Cigarettes   Smokeless tobacco: Never  Vaping Use   Vaping Use: Never used  Substance Use Topics   Alcohol use: Yes    Alcohol/week: 3.0 standard drinks of alcohol    Types: 3 Glasses of wine per week    Comment: occ   Drug use: Never     Medication list has been reviewed and updated.  Current Meds  Medication Sig   albuterol (VENTOLIN HFA) 108 (90 Base) MCG/ACT inhaler USE 2 INHALATIONS ORALLY   EVERY 4 HOURS AS NEEDED   amLODipine (NORVASC) 5 MG tablet Take 1 tablet (5 mg total) by mouth daily.   ascorbic acid (VITAMIN C) 500 MG tablet Take 500 mg by mouth daily.   Calcium Carb-Cholecalciferol (CALCIUM CARBONATE-VITAMIN D3) 600-400 MG-UNIT TABS Take 1 tablet by mouth daily.   celecoxib (CELEBREX) 100 MG capsule Take 1 capsule (100 mg total) by mouth 2 (two) times daily as needed.   cyclobenzaprine (FLEXERIL) 10 MG tablet Take 1 tablet (10 mg total) by mouth at bedtime as needed for muscle spasms.   furosemide (LASIX) 40 MG tablet Take 1 tablet (40 mg total) by mouth daily.   irbesartan (AVAPRO) 300 MG tablet Take 1 tablet (300 mg total) by mouth daily.   metFORMIN (GLUCOPHAGE-XR) 500 MG 24 hr tablet Take 1 tablet (500 mg total) by mouth daily.    metoprolol (TOPROL-XL) 200 MG 24 hr tablet Take 1 tablet (200 mg total) by mouth daily.   molnupiravir EUA (LAGEVRIO) 200 mg CAPS capsule Take 4 capsules (800 mg total) by mouth 2 (two) times daily for 5 days.   montelukast (SINGULAIR) 10 MG tablet TAKE 1 TABLET(10 MG) BY MOUTH AT BEDTIME   Omega-3 Fatty Acids (FISH OIL) 1000 MG CAPS Take 2 capsules by mouth daily.   Semaglutide,0.25 or 0.5MG/DOS, (OZEMPIC, 0.25 OR 0.5 MG/DOSE,) 2 MG/1.5ML SOPN Inject 0.5 mg into the skin once a week.   simvastatin (ZOCOR) 20 MG tablet Take 1 tablet (20 mg total) by mouth daily.   Spacer/Aero-Holding Chambers (AEROCHAMBER MV) inhaler Use as instructed       07/24/2022    9:19 AM 07/20/2022    2:17 PM 04/24/2022    9:03 AM 03/06/2022    8:48 AM  GAD 7 : Generalized Anxiety Score  Nervous, Anxious, on Edge 0 0 0 0  Control/stop worrying 0 0 0 0  Worry too much - different things 0 0 0 0  Trouble relaxing 0 0 0 0  Restless 0 0 0 0  Easily annoyed or irritable 0 0 0 0  Afraid - awful might happen 0 0 0 0  Total GAD 7 Score 0 0 0 0  Anxiety Difficulty Not difficult at all Not difficult at all Not difficult at all Not difficult at all       07/24/2022    9:19 AM 07/20/2022    2:17 PM 04/24/2022    9:03 AM  Depression screen PHQ 2/9  Decreased Interest 0 0 0  Down, Depressed, Hopeless 0 0 0  PHQ - 2 Score 0 0 0  Altered sleeping 0 0 0  Tired, decreased energy 0 0 0  Change in appetite 0 0 0  Feeling bad or failure about yourself  0 0 0  Trouble concentrating 0 0 0  Moving slowly or fidgety/restless 0 0 0  Suicidal thoughts 0 0 0  PHQ-9 Score 0 0 0  Difficult doing work/chores Not difficult at all Not difficult at all Not difficult at all    BP Readings from Last 3 Encounters:  07/24/22 126/86  04/24/22 (!) 170/88  03/06/22 (!) 138/90    Physical Exam Vitals and nursing note reviewed. Exam conducted with a chaperone present.  Constitutional:      General: She is not in acute distress.     Appearance: She is not diaphoretic.  HENT:     Head: Normocephalic and atraumatic.     Right Ear: Tympanic membrane and external ear normal.     Left Ear: Tympanic membrane and external ear normal.     Nose: Nose normal. No congestion or rhinorrhea.     Mouth/Throat:     Mouth: Mucous membranes are moist.  Eyes:     General:        Right eye: No discharge.        Left eye: No discharge.     Extraocular Movements: Extraocular movements intact.     Conjunctiva/sclera: Conjunctivae normal.     Pupils: Pupils are equal, round, and reactive to light.  Neck:     Thyroid: No thyromegaly.     Vascular: No JVD.  Cardiovascular:     Rate and Rhythm: Normal rate and regular rhythm.     Heart sounds: Normal heart sounds. No murmur heard.    No friction rub. No gallop.  Pulmonary:     Effort: Pulmonary effort is normal.     Breath sounds: Normal breath sounds. No wheezing or rhonchi.  Abdominal:     General: Bowel sounds are normal.     Palpations: Abdomen is soft. There is no mass.     Tenderness: There is no abdominal tenderness. There is no guarding or rebound.  Musculoskeletal:        General: Normal range of motion.     Cervical back: Normal range of motion and neck supple.  Lymphadenopathy:     Cervical: No cervical adenopathy.  Skin:    General: Skin is warm and dry.     Findings: No bruising or erythema.  Neurological:     Mental Status: She is alert.     Wt Readings from Last 3 Encounters:  07/24/22 218 lb (98.9 kg)  04/24/22 219 lb (99.3 kg)  03/06/22 216 lb (98 kg)    BP 126/86 (BP Location: Left Arm, Cuff Size: Large)   Pulse 72   Ht _0  (1.676 m)   Wt 218 lb (98.9 kg)   SpO2 93%   BMI 35.19 kg/m   Assessment and Plan:  1. Hypertension associated with type 2 diabetes mellitus (HCC) Chronic.  Controlled.  Stable.  Blood pressure today is 126/86.  This was on third reading with feet flat on the floor and resting for 5 minutes and are not extracted from sleep.   Will continue amlodipine 5 mg once a day, furosemide 40 mg once a day, irbesartan 300 mg once a day, metformin XL 200 mg once a day.  Will check CMP for electrolytes and GFR. - amLODipine (NORVASC) 5 MG tablet; Take 1 tablet (5 mg total) by mouth daily.  Dispense: 90 tablet; Refill: 1 - furosemide (LASIX) 40 MG tablet; Take 1 tablet (40 mg total) by mouth daily.  Dispense: 90 tablet; Refill: 1 - irbesartan (AVAPRO) 300 MG tablet; Take 1 tablet (300 mg total) by mouth daily.  Dispense: 90 tablet; Refill: 1 - metoprolol (TOPROL-XL) 200 MG 24 hr tablet; Take 1 tablet (200 mg total) by mouth daily.  Dispense: 90 tablet; Refill: 1 - Comprehensive Metabolic Panel (CMET)  2. Controlled type 2 diabetes mellitus without complication, without long-term current use of insulin (HCC) Chronic.  Controlled.  Stable.  Continue metformin XR 500 mg once a day and Ozempic 0.5 MGs once a week.  Will check A1c and microalbuminuria for current status. - metFORMIN (GLUCOPHAGE-XR) 500 MG 24 hr tablet; Take 1 tablet (500 mg total) by mouth daily.  Dispense: 90 tablet; Refill: 1 - Semaglutide,0.25 or 0.5MG/DOS, (OZEMPIC, 0.25 OR 0.5 MG/DOSE,) 2 MG/1.5ML SOPN; Inject 0.5 mg into the skin once a week.  Dispense: 4.5 mL; Refill: 1 - HgB A1c - Microalbumin / creatinine urine ratio - Comprehensive Metabolic Panel (CMET)  3. Hyperlipidemia associated with type 2 diabetes mellitus (HCC) Chronic.  Controlled.  Stable.  Continue simvastatin 20 mg once a day.  Will check lipid panel for current L DL status. - simvastatin (ZOCOR) 20 MG tablet; Take 1 tablet (20 mg total) by mouth daily.  Dispense: 90 tablet; Refill: 1 - Comprehensive Metabolic Panel (CMET) - Lipid Panel With LDL/HDL Ratio  4. Menopause Chronic.  Controlled.  Stable.  Patient is menopausal and needs DEXA scan for evaluation of bone density. - DG Bone Density    Otilio Miu, MD

## 2022-07-24 NOTE — Patient Instructions (Signed)

## 2022-07-25 LAB — COMPREHENSIVE METABOLIC PANEL
ALT: 21 IU/L (ref 0–32)
AST: 15 IU/L (ref 0–40)
Albumin/Globulin Ratio: 1.9 (ref 1.2–2.2)
Albumin: 4.5 g/dL (ref 3.9–4.9)
Alkaline Phosphatase: 82 IU/L (ref 44–121)
BUN/Creatinine Ratio: 15 (ref 12–28)
BUN: 12 mg/dL (ref 8–27)
Bilirubin Total: 1.2 mg/dL (ref 0.0–1.2)
CO2: 27 mmol/L (ref 20–29)
Calcium: 10.8 mg/dL — ABNORMAL HIGH (ref 8.7–10.3)
Chloride: 99 mmol/L (ref 96–106)
Creatinine, Ser: 0.8 mg/dL (ref 0.57–1.00)
Globulin, Total: 2.4 g/dL (ref 1.5–4.5)
Glucose: 105 mg/dL — ABNORMAL HIGH (ref 70–99)
Potassium: 4.2 mmol/L (ref 3.5–5.2)
Sodium: 143 mmol/L (ref 134–144)
Total Protein: 6.9 g/dL (ref 6.0–8.5)
eGFR: 82 mL/min/{1.73_m2} (ref >59)

## 2022-07-25 LAB — MICROALBUMIN / CREATININE URINE RATIO
Creatinine, Urine: 71.7 mg/dL
Microalb/Creat Ratio: 10 mg/g creat (ref 0–29)
Microalbumin, Urine: 7.2 ug/mL

## 2022-07-25 LAB — LIPID PANEL WITH LDL/HDL RATIO
Cholesterol, Total: 165 mg/dL (ref 100–199)
HDL: 49 mg/dL (ref >39)
LDL Chol Calc (NIH): 95 mg/dL (ref 0–99)
LDL/HDL Ratio: 1.9 ratio (ref 0.0–3.2)
Triglycerides: 119 mg/dL (ref 0–149)
VLDL Cholesterol Cal: 21 mg/dL (ref 5–40)

## 2022-07-25 LAB — HEMOGLOBIN A1C
Est. average glucose Bld gHb Est-mCnc: 128 mg/dL
Hgb A1c MFr Bld: 6.1 % — ABNORMAL HIGH (ref 4.8–5.6)

## 2022-07-27 ENCOUNTER — Other Ambulatory Visit: Payer: Self-pay | Admitting: Family Medicine

## 2022-07-27 DIAGNOSIS — M4727 Other spondylosis with radiculopathy, lumbosacral region: Secondary | ICD-10-CM

## 2022-07-28 ENCOUNTER — Other Ambulatory Visit: Payer: Self-pay | Admitting: Family Medicine

## 2022-07-28 DIAGNOSIS — E1169 Type 2 diabetes mellitus with other specified complication: Secondary | ICD-10-CM

## 2022-07-30 ENCOUNTER — Other Ambulatory Visit: Payer: Self-pay

## 2022-07-30 DIAGNOSIS — M4727 Other spondylosis with radiculopathy, lumbosacral region: Secondary | ICD-10-CM

## 2022-07-30 MED ORDER — CELECOXIB 100 MG PO CAPS: 100.0000 mg | ORAL_CAPSULE | Freq: Two times a day (BID) | ORAL | 0 refills | Status: DC | PRN

## 2022-07-30 NOTE — Telephone Encounter (Signed)
Requested medication (s) are due for refill today: yes  Requested medication (s) are on the active medication list: yes  Last refill:  04/24/22 #180  Future visit scheduled: yes  Notes to clinic:  overdue labs   Requested Prescriptions  Pending Prescriptions Disp Refills   celecoxib (CELEBREX) 100 MG capsule [Pharmacy Med Name: CELECOXIB 100MG CAPSULES] 180 capsule 0    Sig: TAKE 1 CAPSULE(100 MG) BY MOUTH TWICE DAILY AS NEEDED     Analgesics:  COX2 Inhibitors Failed - 07/27/2022  3:33 AM      Failed - Manual Review: Labs are only required if the patient has taken medication for more than 8 weeks.      Failed - HGB in normal range and within 360 days    No results found for: "HGB", "HGBKUC", "HGBPOCKUC", "HGBOTHER", "TOTHGB", "HGBPLASMA"       Failed - HCT in normal range and within 360 days    No results found for: "HCT", "HCTKUC", "SRHCT"       Passed - Cr in normal range and within 360 days    Creatinine, Ser  Date Value Ref Range Status  07/24/2022 0.80 0.57 - 1.00 mg/dL Final         Passed - AST in normal range and within 360 days    AST  Date Value Ref Range Status  07/24/2022 15 0 - 40 IU/L Final         Passed - ALT in normal range and within 360 days    ALT  Date Value Ref Range Status  07/24/2022 21 0 - 32 IU/L Final         Passed - eGFR is 30 or above and within 360 days    eGFR  Date Value Ref Range Status  07/24/2022 82 >59 mL/min/1.73 Final         Passed - Patient is not pregnant      Passed - Valid encounter within last 12 months    Recent Outpatient Visits           6 days ago Hypertension associated with type 2 diabetes mellitus ([place])   [facility] [place], Deanna C, MD   1 week ago [facility] [place], Deanna C, MD   3 months ago Lumbosacral spondylosis with radiculopathy   [facility]  [place], Earley Abide, MD   4 months ago Lumbosacral spondylosis with radiculopathy   [facility] [place], Earley Abide, MD   5 months ago Lumbosacral spondylosis with radiculopathy   [facility] at [facility], Earley Abide, MD       Future Appointments             In 5 months Juline Patch, MD [facility] at [facility], [facility]

## 2022-08-12 ENCOUNTER — Other Ambulatory Visit: Payer: Self-pay | Admitting: Family Medicine

## 2022-08-12 DIAGNOSIS — J4520 Mild intermittent asthma, uncomplicated: Secondary | ICD-10-CM

## 2022-08-18 ENCOUNTER — Ambulatory Visit
Admission: RE | Admit: 2022-08-18 | Discharge: 2022-08-18 | Disposition: A | Discharge status: Home / Self Care | Payer: Managed Care, Other (non HMO) | Source: Ambulatory Visit | Attending: Family Medicine | Admitting: Family Medicine

## 2022-08-18 DIAGNOSIS — Z780 Asymptomatic menopausal state: Secondary | ICD-10-CM | POA: Diagnosis present

## 2022-08-21 ENCOUNTER — Telehealth: Payer: Self-pay | Admitting: Family Medicine

## 2022-08-21 ENCOUNTER — Telehealth: Payer: Self-pay

## 2022-08-21 NOTE — Telephone Encounter (Signed)
[facility] called asking about Simvastatin to pt. We sent it in back Dec 22/23. I haven't heard anything from the pt, to send it somewhere else. I will wait to see if pt calls or if [facility] requests again.

## 2022-08-21 NOTE — Telephone Encounter (Signed)
Copied from [place] [id number]. Topic: General - Other >> Aug 21, 2022  8:53 AM Sabas Sous wrote: Reason for CRM: [facility] and wants to speak to the clinic regarding the patient's simvastatin order Best contact: [phone] Reference: [id number]

## 2022-09-14 ENCOUNTER — Telehealth: Payer: Self-pay | Admitting: Family Medicine

## 2022-09-14 ENCOUNTER — Other Ambulatory Visit: Payer: Self-pay

## 2022-09-14 DIAGNOSIS — E119 Type 2 diabetes mellitus without complications: Secondary | ICD-10-CM

## 2022-09-14 MED ORDER — OZEMPIC (0.25 OR 0.5 MG/DOSE) 2 MG/1.5ML ~~LOC~~ SOPN: 0.5000 mg | PEN_INJECTOR | SUBCUTANEOUS | 1 refills | Status: DC

## 2022-09-14 NOTE — Telephone Encounter (Signed)
Inhaler removed [facility]  KP

## 2022-09-14 NOTE — Telephone Encounter (Signed)
Pt states that she does not need albuterol (VENTOLIN HFA) 108 (90 Base) MCG/ACT inhaler anymore and is wanting PCP to take this off of her medication list.   Pt is wanting her Semaglutide,0.25 or 0.5MG/DOS, (OZEMPIC, 0.25 OR 0.5 MG/DOSE,) 2 MG/1.5ML SOPN  to come from   [place], [place] to [facility] Phone: [phone]  Fax: [phone]     Pt is wanting it to go through [facility] due to her getting a 90 day supply through them.   Please advise.

## 2022-09-15 ENCOUNTER — Other Ambulatory Visit: Payer: Self-pay | Admitting: Family Medicine

## 2022-09-15 DIAGNOSIS — J4520 Mild intermittent asthma, uncomplicated: Secondary | ICD-10-CM

## 2022-09-15 NOTE — Telephone Encounter (Signed)
Unable to refill per protocol, Rx expired. Discontinued 09/14/22, patient preference.  Requested Prescriptions  Pending Prescriptions Disp Refills   albuterol (VENTOLIN HFA) 108 (90 Base) MCG/ACT inhaler [Pharmacy Med Name: ALBUTEROL PA INH HFA 200] 17 g 0    Sig: USE 2 INHALATIONS ORALLY   EVERY 4 HOURS AS NEEDED     Pulmonology:  Beta Agonists 2 Passed - 09/15/2022  2:05 AM      Passed - Last BP in normal range    BP Readings from Last 1 Encounters:  07/24/22 126/86         Passed - Last Heart Rate in normal range    Pulse Readings from Last 1 Encounters:  07/24/22 72         Passed - Valid encounter within last 12 months    Recent Outpatient Visits           1 month ago Hypertension associated with type 2 diabetes mellitus ([place])   [place] [facility] at [place], Deanna C, MD   1 month ago [place] at [place], [place], MD   4 months ago Lumbosacral spondylosis with radiculopathy   [facility] at [place], Earley Abide, MD   6 months ago Lumbosacral spondylosis with radiculopathy   [facility] [place] at [place], Earley Abide, MD   7 months ago Lumbosacral spondylosis with radiculopathy   [facility] [place] at [facility], Earley Abide, MD       Future Appointments             In 4 months Juline Patch, MD [place] at [facility], [facility]

## 2022-10-08 ENCOUNTER — Telehealth: Payer: Self-pay | Admitting: Family Medicine

## 2022-10-08 ENCOUNTER — Other Ambulatory Visit: Payer: Self-pay | Admitting: Family Medicine

## 2022-10-08 ENCOUNTER — Other Ambulatory Visit: Payer: Self-pay

## 2022-10-08 DIAGNOSIS — E119 Type 2 diabetes mellitus without complications: Secondary | ICD-10-CM

## 2022-10-08 DIAGNOSIS — R7303 Prediabetes: Secondary | ICD-10-CM

## 2022-10-08 NOTE — Telephone Encounter (Signed)
Pt is calling because she is on Ozempic and she was using [facility] and then switched over to [facility], and now [facility]. Pt says [facility] has one more refill but they said the provider didn't approve it. Pt says she has one more dosage left and needs the [facility]ill [facility] is not due another refill from their pharmacy until April. Patient wanted to know if Dr. Ronnald Ramp would approve the refill from [facility]. Please advise.

## 2022-10-08 NOTE — Telephone Encounter (Signed)
Unable to refill per protocol, Rx request is too soon. Last refilled 09/14/22 with 1 refill.  Requested Prescriptions  Pending Prescriptions Disp Refills   OZEMPIC, 0.25 OR 0.5 MG/DOSE, 2 MG/3ML SOPN [Pharmacy Med Name: OZEMPIC 0.25 OR 0.'5MG'$ /DOS1X'2MG'$  3ML] 3 mL     Sig: INJECT 0.'5MG'$  INTO THE SKIN ONCE A WEEK     Endocrinology:  Diabetes - GLP-1 Receptor Agonists - semaglutide Failed - 10/08/2022  3:33 AM      Failed - HBA1C in normal range and within 180 days    Hgb A1c MFr Bld  Date Value Ref Range Status  07/24/2022 6.1 (H) 4.8 - 5.6 % Final    Comment:             Prediabetes: 5.7 - 6.4          Diabetes: >6.4          Glycemic control for adults with diabetes: <7.0          Passed - Cr in normal range and within 360 days    Creatinine, Ser  Date Value Ref Range Status  07/24/2022 0.80 0.57 - 1.00 mg/dL Final         Passed - Valid encounter within last 6 months    Recent Outpatient Visits           2 months ago Hypertension associated with type 2 diabetes mellitus ([place])   [place] [facility] at [place], Deanna C, MD   2 months ago [place] at [place], [place], MD   5 months ago Lumbosacral spondylosis with radiculopathy   [facility] [place] at [place], Earley Abide, MD   7 months ago Lumbosacral spondylosis with radiculopathy   [facility] [place] at [place], Earley Abide, MD   8 months ago Lumbosacral spondylosis with radiculopathy   [facility] [place] at [facility], Earley Abide, MD       Future Appointments             In 3 months Juline Patch, MD [place] at [facility], [facility]

## 2023-01-04 ENCOUNTER — Other Ambulatory Visit: Payer: Self-pay | Admitting: Family Medicine

## 2023-01-04 DIAGNOSIS — E119 Type 2 diabetes mellitus without complications: Secondary | ICD-10-CM

## 2023-01-05 ENCOUNTER — Other Ambulatory Visit: Payer: Self-pay | Admitting: Family Medicine

## 2023-01-05 DIAGNOSIS — Z1231 Encounter for screening mammogram for malignant neoplasm of breast: Secondary | ICD-10-CM

## 2023-01-05 NOTE — Telephone Encounter (Signed)
Requested Prescriptions  Pending Prescriptions Disp Refills   metFORMIN (GLUCOPHAGE-XR) 500 MG 24 hr tablet [Pharmacy Med Name: METFORMIN ER TAB 500MG  GP] 90 tablet 1    Sig: TAKE 1 TABLET DAILY     Endocrinology:  Diabetes - Biguanides Failed - 01/04/2023  2:07 AM      Failed - B12 Level in normal range and within 720 days    No results found for: "VITAMINB12"       Failed - CBC within normal limits and completed in the last 12 months    No results found for: "WBC", "WBCKUC" No results found for: "RBC", "RBCKUC" No results found for: "HGB", "HGBKUC", "HGBPOCKUC", "HGBOTHER", "TOTHGB", "HGBPLASMA", "LABHEMOF" No results found for: "HCT", "HCTKUC", "SRHCT" No results found for: "MCHC", "MCHCKUC" No results found for: "MCH", "MCHKUC" No results found for: "MCVKUC", "MCV" No results found for: "PLTCOUNTKUC", "LABPLAT", "POCPLA" No results found for: "RDW", "RDWKUC", "POCRDW"       Passed - Cr in normal range and within 360 days    Creatinine, Ser  Date Value Ref Range Status  07/24/2022 0.80 0.57 - 1.00 mg/dL Final         Passed - HBA1C is between 0 and 7.9 and within 180 days    Hgb A1c MFr Bld  Date Value Ref Range Status  07/24/2022 6.1 (H) 4.8 - 5.6 % Final    Comment:             Prediabetes: 5.7 - 6.4          Diabetes: >6.4          Glycemic control for adults with diabetes: <7.0          Passed - eGFR in normal range and within 360 days    eGFR  Date Value Ref Range Status  07/24/2022 82 >59 mL/min/1.73 Final         Passed - Valid encounter within last 6 months    Recent Outpatient Visits           5 months ago Hypertension associated with type 2 diabetes mellitus (HCC)   [place] [facility] at [facility], MD   5 months ago COVID   [facility] at [facility], MD   8 months ago Lumbosacral spondylosis with radiculopathy   [facility]  at [facility] [place], Ocie Bob, MD   10 months ago Lumbosacral spondylosis with radiculopathy   [facility] at [facility] [place], Ocie Bob, MD   11 months ago Lumbosacral spondylosis with radiculopathy   [facility] at [facility], Ocie Bob, MD       Future Appointments             In 2 weeks Duanne Limerick, MD [facility] at [facility], [facility]

## 2023-01-19 ENCOUNTER — Other Ambulatory Visit: Payer: Self-pay

## 2023-01-19 DIAGNOSIS — E1169 Type 2 diabetes mellitus with other specified complication: Secondary | ICD-10-CM

## 2023-01-19 MED ORDER — SIMVASTATIN 20 MG PO TABS: 20.0000 mg | ORAL_TABLET | Freq: Every day | ORAL | 0 refills | Status: DC

## 2023-01-25 ENCOUNTER — Encounter: Payer: Self-pay | Admitting: Family Medicine

## 2023-01-25 ENCOUNTER — Other Ambulatory Visit
Admission: RE | Admit: 2023-01-25 | Discharge: 2023-01-25 | Disposition: A | Discharge status: Home / Self Care | Payer: Managed Care, Other (non HMO) | Attending: Family Medicine | Admitting: Family Medicine

## 2023-01-25 ENCOUNTER — Ambulatory Visit (INDEPENDENT_AMBULATORY_CARE_PROVIDER_SITE_OTHER): Payer: Managed Care, Other (non HMO) | Admitting: Family Medicine

## 2023-01-25 VITALS — BP 128/76 | HR 80 | Ht 66.0 in | Wt 218.0 lb

## 2023-01-25 DIAGNOSIS — E119 Type 2 diabetes mellitus without complications: Secondary | ICD-10-CM

## 2023-01-25 DIAGNOSIS — E1169 Type 2 diabetes mellitus with other specified complication: Secondary | ICD-10-CM | POA: Diagnosis not present

## 2023-01-25 DIAGNOSIS — E1159 Type 2 diabetes mellitus with other circulatory complications: Secondary | ICD-10-CM

## 2023-01-25 DIAGNOSIS — R7303 Prediabetes: Secondary | ICD-10-CM | POA: Diagnosis not present

## 2023-01-25 DIAGNOSIS — I10 Essential (primary) hypertension: Secondary | ICD-10-CM | POA: Diagnosis present

## 2023-01-25 DIAGNOSIS — E785 Hyperlipidemia, unspecified: Secondary | ICD-10-CM

## 2023-01-25 DIAGNOSIS — I152 Hypertension secondary to endocrine disorders: Secondary | ICD-10-CM | POA: Diagnosis not present

## 2023-01-25 DIAGNOSIS — Z7984 Long term (current) use of oral hypoglycemic drugs: Secondary | ICD-10-CM

## 2023-01-25 LAB — RENAL FUNCTION PANEL
Albumin: 4.2 g/dL (ref 3.5–5.0)
Anion gap: 10 (ref 5–15)
BUN: 17 mg/dL (ref 8–23)
CO2: 27 mmol/L (ref 22–32)
Calcium: 9.4 mg/dL (ref 8.9–10.3)
Chloride: 102 mmol/L (ref 98–111)
Creatinine, Ser: 0.74 mg/dL (ref 0.44–1.00)
GFR, Estimated: >60 mL/min (ref >60)
Glucose, Bld: 125 mg/dL — ABNORMAL HIGH (ref 70–99)
Phosphorus: 3.8 mg/dL (ref 2.5–4.6)
Potassium: 3.9 mmol/L (ref 3.5–5.1)
Sodium: 139 mmol/L (ref 135–145)

## 2023-01-25 LAB — HEMOGLOBIN A1C
Hgb A1c MFr Bld: 5.8 % — ABNORMAL HIGH (ref 4.8–5.6)
Mean Plasma Glucose: 119.76 mg/dL

## 2023-01-25 MED ORDER — FUROSEMIDE 40 MG PO TABS: 40.0000 mg | ORAL_TABLET | Freq: Every day | ORAL | 1 refills | Status: DC

## 2023-01-25 MED ORDER — METFORMIN HCL ER 500 MG PO TB24: 500.0000 mg | ORAL_TABLET | Freq: Every day | ORAL | 1 refills | Status: DC

## 2023-01-25 MED ORDER — SIMVASTATIN 20 MG PO TABS: 20.0000 mg | ORAL_TABLET | Freq: Every day | ORAL | 1 refills | Status: DC

## 2023-01-25 MED ORDER — METOPROLOL SUCCINATE ER 200 MG PO TB24: 200.0000 mg | ORAL_TABLET | Freq: Every day | ORAL | 1 refills | Status: DC

## 2023-01-25 MED ORDER — AMLODIPINE BESYLATE 5 MG PO TABS: 5.0000 mg | ORAL_TABLET | Freq: Every day | ORAL | 1 refills | Status: DC

## 2023-01-25 MED ORDER — IRBESARTAN 300 MG PO TABS: 300.0000 mg | ORAL_TABLET | Freq: Every day | ORAL | 1 refills | Status: DC

## 2023-01-25 MED ORDER — MONTELUKAST SODIUM 10 MG PO TABS: ORAL_TABLET | ORAL | 1 refills | Status: DC

## 2023-01-25 NOTE — Progress Notes (Signed)
Date:  01/25/2023   Name:  Barbara Cortez   DOB:  1955-12-14   MRN:  [id number]   Chief Complaint: Hypertension, Hyperlipidemia, Allergic Rhinitis , Edema, and Prediabetes  Hypertension This is a chronic problem. The current episode started more than 1 year ago. The problem has been gradually improving since onset. The problem is controlled. Pertinent negatives include no chest pain, headaches, orthopnea, palpitations, PND or shortness of breath. There are no associated agents to hypertension. Past treatments include angiotensin blockers and beta blockers. The current treatment provides moderate improvement. There are no compliance problems.  There is no history of CAD/MI, CVA, heart failure or PVD. There is no history of chronic renal disease, a hypertension causing med or renovascular disease.  Hyperlipidemia This is a chronic problem. The current episode started more than 1 year ago. The problem is controlled. Recent lipid tests were reviewed and are normal. She has no history of chronic renal disease or diabetes. Pertinent negatives include no chest pain, focal weakness, myalgias or shortness of breath. Current antihyperlipidemic treatment includes statins. The current treatment provides moderate improvement of lipids. There are no compliance problems.  Risk factors for coronary artery disease include diabetes mellitus and hypertension.  Diabetes She presents for her follow-up diabetic visit. She has type 2 diabetes mellitus. Her disease course has been stable. Pertinent negatives for hypoglycemia include no confusion, dizziness, headaches, nervousness/anxiousness or pallor. Pertinent negatives for diabetes include no chest pain, no fatigue and no weakness. There are no hypoglycemic complications. Symptoms are stable. There are no diabetic complications. Pertinent negatives for diabetic complications include no CVA or PVD. Risk factors for coronary artery disease include dyslipidemia and  hypertension. Current diabetic treatment includes oral agent (monotherapy). Her weight is stable. She is following a generally healthy diet. Meal planning includes avoidance of concentrated sweets and carbohydrate counting. She participates in exercise intermittently. An ACE inhibitor/angiotensin II receptor blocker is being taken.    Lab Results  Component Value Date   NA 143 07/24/2022   K 4.2 07/24/2022   CO2 27 07/24/2022   GLUCOSE 105 (H) 07/24/2022   BUN 12 07/24/2022   CREATININE 0.80 07/24/2022   CALCIUM 10.8 (H) 07/24/2022   EGFR 82 07/24/2022   Lab Results  Component Value Date   CHOL 165 07/24/2022   HDL 49 07/24/2022   LDLCALC 95 07/24/2022   TRIG 119 07/24/2022   Lab Results  Component Value Date   TSH 1.650 06/24/2021   Lab Results  Component Value Date   HGBA1C 6.1 (H) 07/24/2022   No results found for: "WBC", "HGB", "HCT", "MCV", "PLT" Lab Results  Component Value Date   ALT 21 07/24/2022   AST 15 07/24/2022   ALKPHOS 82 07/24/2022   BILITOT 1.2 07/24/2022   No results found for: "25OHVITD2", "25OHVITD3", "VD25OH"   Review of Systems  Constitutional: Negative.  Negative for chills, fatigue, fever and unexpected weight change.  HENT:  Negative for congestion, ear discharge, ear pain, rhinorrhea, sinus pressure, sneezing and sore throat.   Respiratory:  Negative for cough, shortness of breath, wheezing and stridor.   Cardiovascular:  Negative for chest pain, palpitations, orthopnea and PND.  Gastrointestinal:  Negative for abdominal pain, blood in stool, constipation, diarrhea and nausea.  Genitourinary:  Negative for dysuria, flank pain, frequency, hematuria, urgency and vaginal discharge.  Musculoskeletal:  Negative for arthralgias, back pain and myalgias.  Skin:  Negative for pallor and rash.  Neurological:  Negative for dizziness, focal weakness, weakness and  headaches.  Hematological:  Negative for adenopathy. Does not bruise/bleed easily.   Psychiatric/Behavioral:  Negative for confusion and dysphoric mood. The patient is not nervous/anxious.     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   Lymphedema of both lower extremities 10/19/2018   Body mass index (BMI) of 35.0 to 35.9 with comorbidity 07/14/2018   Lumbosacral spondylosis with radiculopathy 07/14/2018   High risk medication use 06/19/2015   Tubular adenoma of colon 01/29/2015   Diabetes mellitus type II, controlled (HCC) 06/05/2014   Chronic insomnia 02/05/2014   Hyperlipidemia associated with type 2 diabetes mellitus (HCC) 02/05/2014   Hypertension associated with type 2 diabetes mellitus (HCC) 02/05/2014   Plantar fasciitis 02/05/2014    Allergies  Allergen Reactions   Ace Inhibitors Cough   Hydrochlorothiazide Itching    Photosensitivity    Past Surgical History:  Procedure Laterality Date   AUGMENTATION MAMMAPLASTY Bilateral 2000   COLONOSCOPY WITH PROPOFOL N/A 01/29/2015   Procedure: COLONOSCOPY WITH PROPOFOL;  Surgeon: Christena Deem, MD;  [facility] END[facility]COPY;  Service: Endoscopy;  Laterality: N/A;   Hysteroscopy wtih enodmetrial ablation     Saline Breast Implants     SEPTOPLASTY      Social History   Tobacco Use   Smoking status: Some Days    Packs/day: 0.25    Years: 30.00    Additional pack years: 0.00    Total pack years: 7.50    Types: Cigarettes   Smokeless tobacco: Never  Vaping Use   Vaping Use: Never used  Substance Use Topics   Alcohol use: Yes    Alcohol/week: 3.0 standard drinks of alcohol    Types: 3 Glasses of wine per week    Comment: occ   Drug use: Never     Medication list has been reviewed and updated.  Current Meds  Medication Sig   amLODipine (NORVASC) 5 MG tablet Take 1 tablet (5 mg total) by mouth daily.   ascorbic acid (VITAMIN C) 500 MG tablet Take 500 mg by mouth daily.   Calcium Carb-Cholecalciferol (CALCIUM CARBONATE-VITAMIN D3) 600-400 MG-UNIT TABS Take  1 tablet by mouth daily.   celecoxib (CELEBREX) 100 MG capsule Take 1 capsule (100 mg total) by mouth 2 (two) times daily as needed. (Patient taking differently: Take 100 mg by mouth daily.)   cyclobenzaprine (FLEXERIL) 10 MG tablet Take 1 tablet (10 mg total) by mouth at bedtime as needed for muscle spasms.   furosemide (LASIX) 40 MG tablet Take 1 tablet (40 mg total) by mouth daily.   irbesartan (AVAPRO) 300 MG tablet Take 1 tablet (300 mg total) by mouth daily.   metFORMIN (GLUCOPHAGE-XR) 500 MG 24 hr tablet TAKE 1 TABLET DAILY   metoprolol (TOPROL-XL) 200 MG 24 hr tablet Take 1 tablet (200 mg total) by mouth daily.   Omega-3 Fatty Acids (FISH OIL) 1000 MG CAPS Take 2 capsules by mouth daily.   Semaglutide,0.25 or 0.5MG /DOS, (OZEMPIC, 0.25 OR 0.5 MG/DOSE,) 2 MG/1.5ML SOPN Inject 0.5 mg into the skin once a week.   simvastatin (ZOCOR) 20 MG tablet Take 1 tablet (20 mg total) by mouth daily.   Spacer/Aero-Holding Chambers (AEROCHAMBER MV) inhaler Use as instructed       01/25/2023    9:07 AM 07/24/2022    9:19 AM 07/20/2022    2:17 PM 04/24/2022    9:03 AM  GAD 7 : Generalized Anxiety Score  Nervous, Anxious, on Edge 0 0 0 0  Control/stop  worrying 0 0 0 0  Worry too much - different things 0 0 0 0  Trouble relaxing 0 0 0 0  Restless 0 0 0 0  Easily annoyed or irritable 0 0 0 0  Afraid - awful might happen 0 0 0 0  Total GAD 7 Score 0 0 0 0  Anxiety Difficulty Not difficult at all Not difficult at all Not difficult at all Not difficult at all       01/25/2023    9:07 AM 07/24/2022    9:19 AM 07/20/2022    2:17 PM  Depression screen PHQ 2/9  Decreased Interest 0 0 0  Down, Depressed, Hopeless 0 0 0  PHQ - 2 Score 0 0 0  Altered sleeping 0 0 0  Tired, decreased energy 0 0 0  Change in appetite 0 0 0  Feeling bad or failure about yourself  0 0 0  Trouble concentrating 0 0 0  Moving slowly or fidgety/restless 0 0 0  Suicidal thoughts 0 0 0  PHQ-9 Score 0 0 0  Difficult doing  work/chores Not difficult at all Not difficult at all Not difficult at all    BP Readings from Last 3 Encounters:  01/25/23 128/76  07/24/22 126/86  04/24/22 (!) 170/88    Physical Exam Vitals and nursing note reviewed. Exam conducted with a chaperone present.  Constitutional:      General: She is not in acute distress.    Appearance: She is not diaphoretic.  HENT:     Head: Normocephalic and atraumatic.     Right Ear: External ear normal.     Left Ear: External ear normal.     Nose: Nose normal.  Eyes:     General:        Right eye: No discharge.        Left eye: No discharge.     Conjunctiva/sclera: Conjunctivae normal.     Pupils: Pupils are equal, round, and reactive to light.  Neck:     Thyroid: No thyromegaly.     Vascular: No JVD.  Cardiovascular:     Rate and Rhythm: Normal rate and regular rhythm.     Heart sounds: Normal heart sounds. No murmur heard.    No friction rub. No gallop.  Pulmonary:     Effort: Pulmonary effort is normal.     Breath sounds: Normal breath sounds.  Abdominal:     General: Bowel sounds are normal.     Palpations: Abdomen is soft. There is no mass.     Tenderness: There is no abdominal tenderness. There is no guarding.  Musculoskeletal:        General: Normal range of motion.     Cervical back: Neck supple.  Lymphadenopathy:     Cervical: No cervical adenopathy.  Skin:    General: Skin is warm and dry.  Neurological:     Mental Status: She is alert.     Deep Tendon Reflexes: Reflexes are normal and symmetric.     Reflex Scores:      Patellar reflexes are 2+ on the right side and 2+ on the left side.    Wt Readings from Last 3 Encounters:  01/25/23 218 lb (98.9 kg)  07/24/22 218 lb (98.9 kg)  04/24/22 219 lb (99.3 kg)    BP 128/76   Pulse 80   Ht 5\' 6"  (1.676 m)   Wt 218 lb (98.9 kg)   SpO2 98%   BMI 35.19 kg/m  Assessment and Plan:  1. Controlled type 2 diabetes mellitus without complication, without long-term  current use of insulin (HCC) Chronic.  Controlled.  Stable.  Currently has been out of Ozempic for 2 weeks.  We will check A1c at current dosing of metformin XR 500 mg once a day as well as reemphasized dietary guidelines.  If A1c is below 7 which would indicate controlled with metformin and diet will likely remain off of Ozempic at this time. - metFORMIN (GLUCOPHAGE-XR) 500 MG 24 hr tablet; Take 1 tablet (500 mg total) by mouth daily.  Dispense: 90 tablet; Refill: 1  2. Hypertension associated with type 2 diabetes mellitus (HCC) Chronic.  Controlled.  Stable.  Blood pressure 128/76.  Asymptomatic.  Tolerating medications well.  Continue amlodipine 5 mg once a day, Lasix 40 mg once a day and irbesartan 300 mg once a day, and metoprolol XL 200 mg once a day.  Will recheck patient in 4 months. - amLODipine (NORVASC) 5 MG tablet; Take 1 tablet (5 mg total) by mouth daily.  Dispense: 90 tablet; Refill: 1 - furosemide (LASIX) 40 MG tablet; Take 1 tablet (40 mg total) by mouth daily.  Dispense: 90 tablet; Refill: 1 - irbesartan (AVAPRO) 300 MG tablet; Take 1 tablet (300 mg total) by mouth daily.  Dispense: 90 tablet; Refill: 1 - metoprolol (TOPROL-XL) 200 MG 24 hr tablet; Take 1 tablet (200 mg total) by mouth daily.  Dispense: 90 tablet; Refill: 1  3. Hyperlipidemia associated with type 2 diabetes mellitus (HCC) Chronic.  Controlled.  Stable.  Continue simvastatin 20 mg once a day. - simvastatin (ZOCOR) 20 MG tablet; Take 1 tablet (20 mg total) by mouth daily.  Dispense: 90 tablet; Refill: 1    Elizabeth Sauer, MD

## 2023-01-28 ENCOUNTER — Ambulatory Visit
Admission: RE | Admit: 2023-01-28 | Discharge: 2023-01-28 | Disposition: A | Discharge status: Home / Self Care | Payer: Managed Care, Other (non HMO) | Source: Ambulatory Visit | Attending: Family Medicine | Admitting: Family Medicine

## 2023-01-28 DIAGNOSIS — Z1231 Encounter for screening mammogram for malignant neoplasm of breast: Secondary | ICD-10-CM | POA: Diagnosis present

## 2023-03-02 LAB — HM DIABETES EYE EXAM

## 2023-05-28 ENCOUNTER — Ambulatory Visit (INDEPENDENT_AMBULATORY_CARE_PROVIDER_SITE_OTHER): Payer: Managed Care, Other (non HMO) | Admitting: Family Medicine

## 2023-05-28 ENCOUNTER — Encounter: Payer: Self-pay | Admitting: Family Medicine

## 2023-05-28 VITALS — BP 118/76 | HR 76 | Ht 66.0 in | Wt 230.2 lb

## 2023-05-28 DIAGNOSIS — E1159 Type 2 diabetes mellitus with other circulatory complications: Secondary | ICD-10-CM | POA: Diagnosis not present

## 2023-05-28 DIAGNOSIS — E119 Type 2 diabetes mellitus without complications: Secondary | ICD-10-CM | POA: Diagnosis not present

## 2023-05-28 DIAGNOSIS — E785 Hyperlipidemia, unspecified: Secondary | ICD-10-CM

## 2023-05-28 DIAGNOSIS — Z7984 Long term (current) use of oral hypoglycemic drugs: Secondary | ICD-10-CM

## 2023-05-28 DIAGNOSIS — E1169 Type 2 diabetes mellitus with other specified complication: Secondary | ICD-10-CM | POA: Diagnosis not present

## 2023-05-28 DIAGNOSIS — I152 Hypertension secondary to endocrine disorders: Secondary | ICD-10-CM

## 2023-05-28 MED ORDER — METOPROLOL SUCCINATE ER 200 MG PO TB24: 200.0000 mg | ORAL_TABLET | Freq: Every day | ORAL | 1 refills | Status: DC

## 2023-05-28 MED ORDER — FUROSEMIDE 40 MG PO TABS: 40.0000 mg | ORAL_TABLET | Freq: Every day | ORAL | 1 refills | Status: DC

## 2023-05-28 MED ORDER — AMLODIPINE BESYLATE 5 MG PO TABS: 5.0000 mg | ORAL_TABLET | Freq: Every day | ORAL | 1 refills | Status: DC

## 2023-05-28 MED ORDER — METFORMIN HCL ER 500 MG PO TB24: 500.0000 mg | ORAL_TABLET | Freq: Every day | ORAL | 1 refills | Status: DC

## 2023-05-28 MED ORDER — SIMVASTATIN 20 MG PO TABS: 20.0000 mg | ORAL_TABLET | Freq: Every day | ORAL | 1 refills | Status: DC

## 2023-05-28 MED ORDER — IRBESARTAN 300 MG PO TABS: 300.0000 mg | ORAL_TABLET | Freq: Every day | ORAL | 1 refills | Status: DC

## 2023-05-28 NOTE — Progress Notes (Signed)
Date:  05/28/2023   Name:  Barbara Cortez   DOB:  1956/07/01   MRN:  [id number]   Chief Complaint: Diabetes, Hypertension, and Hyperlipidemia  Diabetes She presents for her follow-up diabetic visit. She has type 2 diabetes mellitus. Her disease course has been stable. Pertinent negatives for hypoglycemia include no dizziness, headaches or nervousness/anxiousness. Associated symptoms include polyuria. Pertinent negatives for diabetes include no chest pain, no fatigue, no polydipsia, no visual change and no weakness. Symptoms are stable. Current diabetic treatment includes oral agent (monotherapy). She is following a generally healthy diet.  Hypertension This is a chronic problem. The current episode started 1 to 4 weeks ago. Pertinent negatives include no chest pain, headaches, neck pain, orthopnea, palpitations or shortness of breath. There are no associated agents to hypertension. Risk factors for coronary artery disease include dyslipidemia. Past treatments include angiotensin blockers, calcium channel blockers, diuretics and beta blockers. The current treatment provides moderate improvement. There are no compliance problems.  There is no history of chronic renal disease.  Hyperlipidemia This is a chronic problem. The current episode started more than 1 year ago. The problem is controlled. Recent lipid tests were reviewed and are variable. She has no history of chronic renal disease, diabetes, hypothyroidism, liver disease, obesity or nephrotic syndrome. Pertinent negatives include no chest pain, myalgias or shortness of breath. Current antihyperlipidemic treatment includes statins. The current treatment provides mild improvement of lipids.    Lab Results  Component Value Date   NA 139 01/25/2023   K 3.9 01/25/2023   CO2 27 01/25/2023   GLUCOSE 125 (H) 01/25/2023   BUN 17 01/25/2023   CREATININE 0.74 01/25/2023   CALCIUM 9.4 01/25/2023   EGFR 82 07/24/2022   GFRNONAA >60  01/25/2023   Lab Results  Component Value Date   CHOL 165 07/24/2022   HDL 49 07/24/2022   LDLCALC 95 07/24/2022   TRIG 119 07/24/2022   Lab Results  Component Value Date   TSH 1.650 06/24/2021   Lab Results  Component Value Date   HGBA1C 5.8 (H) 01/25/2023   No results found for: "WBC", "HGB", "HCT", "MCV", "PLT" Lab Results  Component Value Date   ALT 21 07/24/2022   AST 15 07/24/2022   ALKPHOS 82 07/24/2022   BILITOT 1.2 07/24/2022   No results found for: "25OHVITD2", "25OHVITD3", "VD25OH"   Review of Systems  Constitutional: Negative.  Negative for chills, fatigue, fever and unexpected weight change.  HENT:  Negative for congestion, ear discharge, ear pain, postnasal drip, rhinorrhea, sinus pressure, sneezing and sore throat.   Respiratory:  Negative for cough, chest tightness, shortness of breath, wheezing and stridor.   Cardiovascular:  Negative for chest pain, palpitations and orthopnea.  Gastrointestinal:  Negative for abdominal distention, abdominal pain, blood in stool, constipation, diarrhea and nausea.       Bloating  Endocrine: Positive for polyuria. Negative for polydipsia.  Genitourinary:  Positive for frequency and urgency. Negative for dysuria, flank pain, hematuria and vaginal bleeding.  Musculoskeletal:  Negative for arthralgias, back pain, myalgias and neck pain.  Skin:  Negative for rash.  Neurological:  Negative for dizziness, weakness and headaches.  Hematological:  Negative for adenopathy. Does not bruise/bleed easily.  Psychiatric/Behavioral:  Negative for dysphoric mood. The patient is not nervous/anxious.     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   Lymphedema of both lower extremities 10/19/2018   Body mass index (BMI) of 35.0 to 35.9 with  comorbidity 07/14/2018   Lumbosacral spondylosis with radiculopathy 07/14/2018   High risk medication use 06/19/2015   Tubular adenoma of colon 01/29/2015    Diabetes mellitus type II, controlled (HCC) 06/05/2014   Chronic insomnia 02/05/2014   Hyperlipidemia associated with type 2 diabetes mellitus (HCC) 02/05/2014   Hypertension associated with type 2 diabetes mellitus (HCC) 02/05/2014   Plantar fasciitis 02/05/2014    Allergies  Allergen Reactions   Ace Inhibitors Cough   Hydrochlorothiazide Itching    Photosensitivity    Past Surgical History:  Procedure Laterality Date   AUGMENTATION MAMMAPLASTY Bilateral 2000   COLONOSCOPY WITH PROPOFOL N/A 01/29/2015   Procedure: COLONOSCOPY WITH PROPOFOL;  Surgeon: Christena Deem, MD;  Location: [facility] ENDOSCOPY;  Service: Endoscopy;  Laterality: N/A;   Hysteroscopy wtih enodmetrial ablation     Saline Breast Implants     SEPTOPLASTY      Social History   Tobacco Use   Smoking status: Some Days    Current packs/day: 0.25    Average packs/day: 0.3 packs/day for 30.0 years (7.5 ttl pk-yrs)    Types: Cigarettes   Smokeless tobacco: Never  Vaping Use   Vaping status: Never Used  Substance Use Topics   Alcohol use: Yes    Alcohol/week: 3.0 standard drinks of alcohol    Types: 3 Glasses of wine per week    Comment: occ   Drug use: Never     Medication list has been reviewed and updated.  Current Meds  Medication Sig   amLODipine (NORVASC) 5 MG tablet Take 1 tablet (5 mg total) by mouth daily.   ascorbic acid (VITAMIN C) 500 MG tablet Take 500 mg by mouth daily.   Calcium Carb-Cholecalciferol (CALCIUM CARBONATE-VITAMIN D3) 600-400 MG-UNIT TABS Take 1 tablet by mouth daily.   furosemide (LASIX) 40 MG tablet Take 1 tablet (40 mg total) by mouth daily.   irbesartan (AVAPRO) 300 MG tablet Take 1 tablet (300 mg total) by mouth daily.   metFORMIN (GLUCOPHAGE-XR) 500 MG 24 hr tablet Take 1 tablet (500 mg total) by mouth daily.   metoprolol (TOPROL-XL) 200 MG 24 hr tablet Take 1 tablet (200 mg total) by mouth daily.   montelukast (SINGULAIR) 10 MG tablet TAKE 1 TABLET(10 MG) BY MOUTH AT  BEDTIME   Omega-3 Fatty Acids (FISH OIL) 1000 MG CAPS Take 2 capsules by mouth daily.   simvastatin (ZOCOR) 20 MG tablet Take 1 tablet (20 mg total) by mouth daily.   Spacer/Aero-Holding Chambers (AEROCHAMBER MV) inhaler Use as instructed   traZODone (DESYREL) 50 MG tablet Take 50 mg by mouth at bedtime.   [DISCONTINUED] celecoxib (CELEBREX) 100 MG capsule Take 1 capsule (100 mg total) by mouth 2 (two) times daily as needed. (Patient taking differently: Take 100 mg by mouth daily.)   [DISCONTINUED] cyclobenzaprine (FLEXERIL) 10 MG tablet Take 1 tablet (10 mg total) by mouth at bedtime as needed for muscle spasms.   [DISCONTINUED] Semaglutide,0.25 or 0.5MG /DOS, (OZEMPIC, 0.25 OR 0.5 MG/DOSE,) 2 MG/1.5ML SOPN Inject 0.5 mg into the skin once a week.       05/28/2023    9:00 AM 01/25/2023    9:07 AM 07/24/2022    9:19 AM 07/20/2022    2:17 PM  GAD 7 : Generalized Anxiety Score  Nervous, Anxious, on Edge 0 0 0 0  Control/stop worrying 0 0 0 0  Worry too much - different things 0 0 0 0  Trouble relaxing 0 0 0 0  Restless 0 0 0 0  Easily annoyed or irritable 0 0 0 0  Afraid - awful might happen 0 0 0 0  Total GAD 7 Score 0 0 0 0  Anxiety Difficulty Not difficult at all Not difficult at all Not difficult at all Not difficult at all       05/28/2023    8:59 AM 01/25/2023    9:07 AM 07/24/2022    9:19 AM  Depression screen PHQ 2/9  Decreased Interest 0 0 0  Down, Depressed, Hopeless 0 0 0  PHQ - 2 Score 0 0 0  Altered sleeping 0 0 0  Tired, decreased energy 0 0 0  Change in appetite 0 0 0  Feeling bad or failure about yourself  0 0 0  Trouble concentrating 0 0 0  Moving slowly or fidgety/restless 0 0 0  Suicidal thoughts 0 0 0  PHQ-9 Score 0 0 0  Difficult doing work/chores Not difficult at all Not difficult at all Not difficult at all    BP Readings from Last 3 Encounters:  05/28/23 118/76  01/25/23 128/76  07/24/22 126/86    Physical Exam Vitals and nursing note  reviewed. Exam conducted with a chaperone present.  Constitutional:      General: She is not in acute distress.    Appearance: She is not diaphoretic.  HENT:     Head: Normocephalic and atraumatic.     Right Ear: Tympanic membrane and external ear normal. There is no impacted cerumen.     Left Ear: Tympanic membrane and external ear normal. There is no impacted cerumen.     Nose: Nose normal. No congestion or rhinorrhea.     Mouth/Throat:     Mouth: Mucous membranes are moist.  Eyes:     General:        Right eye: No discharge.        Left eye: No discharge.     Conjunctiva/sclera: Conjunctivae normal.     Pupils: Pupils are equal, round, and reactive to light.  Neck:     Thyroid: No thyromegaly.     Vascular: No JVD.  Cardiovascular:     Rate and Rhythm: Normal rate and regular rhythm.     Heart sounds: Normal heart sounds. No murmur heard.    No friction rub. No gallop.  Pulmonary:     Effort: Pulmonary effort is normal. No respiratory distress.     Breath sounds: Normal breath sounds. No stridor. No wheezing, rhonchi or rales.  Chest:     Chest wall: No tenderness.  Abdominal:     General: Bowel sounds are normal.     Palpations: Abdomen is soft. There is no mass.     Tenderness: There is no abdominal tenderness. There is no guarding.  Musculoskeletal:        General: Normal range of motion.     Cervical back: Normal range of motion and neck supple.  Lymphadenopathy:     Cervical: No cervical adenopathy.  Skin:    General: Skin is warm and dry.  Neurological:     Mental Status: She is alert.     Deep Tendon Reflexes: Reflexes are normal and symmetric.     Wt Readings from Last 3 Encounters:  05/28/23 230 lb 3.2 oz (104.4 kg)  01/25/23 218 lb (98.9 kg)  07/24/22 218 lb (98.9 kg)    BP 118/76   Pulse 76   Ht 5\' 6"  (1.676 m)   Wt 230 lb 3.2 oz (104.4 kg)   SpO2  96%   BMI 37.16 kg/m   Assessment and Plan: 1. Hypertension associated with type 2 diabetes  mellitus (HCC) Chronic.  Controlled.  Stable.  Blood pressure 118/76.  Asymptomatic.  Tolerating medications well.  Continue amlodipine 5 mg once a day Lasix 40 mg once a day Avapro 300 mg once a day and metoprolol XL 200 mg once a day.  Will recheck in 6 months.  Previous review of renal function panel is acceptable. - amLODipine (NORVASC) 5 MG tablet; Take 1 tablet (5 mg total) by mouth daily.  Dispense: 90 tablet; Refill: 1 - furosemide (LASIX) 40 MG tablet; Take 1 tablet (40 mg total) by mouth daily.  Dispense: 90 tablet; Refill: 1 - irbesartan (AVAPRO) 300 MG tablet; Take 1 tablet (300 mg total) by mouth daily.  Dispense: 90 tablet; Refill: 1 - metoprolol (TOPROL-XL) 200 MG 24 hr tablet; Take 1 tablet (200 mg total) by mouth daily.  Dispense: 90 tablet; Refill: 1  2. Controlled type 2 diabetes mellitus without complication, without long-term current use of insulin (HCC) Chronic.  Controlled.  Stable.  Asymptomatic.  Patient is having some polyuria frequency we will check A1c for current level of control continue metformin XR 500 mg daily. - metFORMIN (GLUCOPHAGE-XR) 500 MG 24 hr tablet; Take 1 tablet (500 mg total) by mouth daily.  Dispense: 90 tablet; Refill: 1 A1c  3. Hyperlipidemia associated with type 2 diabetes mellitus (HCC) Chronic.  Controlled.  Stable.  Asymptomatic without myalgias.  Continue simvastatin 20 mg once a day. - simvastatin (ZOCOR) 20 MG tablet; Take 1 tablet (20 mg total) by mouth daily.  Dispense: 90 tablet; Refill: 1 Lipid panel  Patient was concerned about abdominal bloating and discomfort with diarrhea/constipation alternating.  Patient is going to return for evaluation for abdominal discomfort.  Elizabeth Sauer, MD

## 2023-05-29 ENCOUNTER — Encounter: Payer: Self-pay | Admitting: Family Medicine

## 2023-05-29 LAB — LIPID PANEL WITH LDL/HDL RATIO
Cholesterol, Total: 224 mg/dL — ABNORMAL HIGH (ref 100–199)
HDL: 59 mg/dL (ref >39)
LDL Chol Calc (NIH): 132 mg/dL — ABNORMAL HIGH (ref 0–99)
LDL/HDL Ratio: 2.2 ratio (ref 0.0–3.2)
Triglycerides: 186 mg/dL — ABNORMAL HIGH (ref 0–149)
VLDL Cholesterol Cal: 33 mg/dL (ref 5–40)

## 2023-05-29 LAB — HEMOGLOBIN A1C
Est. average glucose Bld gHb Est-mCnc: 146 mg/dL
Hgb A1c MFr Bld: 6.7 % — ABNORMAL HIGH (ref 4.8–5.6)

## 2023-06-08 ENCOUNTER — Encounter: Payer: Self-pay | Admitting: Family Medicine

## 2023-06-08 ENCOUNTER — Ambulatory Visit (INDEPENDENT_AMBULATORY_CARE_PROVIDER_SITE_OTHER): Payer: Managed Care, Other (non HMO) | Admitting: Family Medicine

## 2023-06-08 VITALS — BP 134/84 | HR 76 | Ht 66.0 in | Wt 233.0 lb

## 2023-06-08 DIAGNOSIS — F17210 Nicotine dependence, cigarettes, uncomplicated: Secondary | ICD-10-CM

## 2023-06-08 DIAGNOSIS — I152 Hypertension secondary to endocrine disorders: Secondary | ICD-10-CM

## 2023-06-08 DIAGNOSIS — E119 Type 2 diabetes mellitus without complications: Secondary | ICD-10-CM

## 2023-06-08 DIAGNOSIS — Z6837 Body mass index (BMI) 37.0-37.9, adult: Secondary | ICD-10-CM

## 2023-06-08 DIAGNOSIS — E1169 Type 2 diabetes mellitus with other specified complication: Secondary | ICD-10-CM | POA: Diagnosis not present

## 2023-06-08 DIAGNOSIS — E785 Hyperlipidemia, unspecified: Secondary | ICD-10-CM

## 2023-06-08 DIAGNOSIS — E1159 Type 2 diabetes mellitus with other circulatory complications: Secondary | ICD-10-CM

## 2023-06-08 DIAGNOSIS — R5381 Other malaise: Secondary | ICD-10-CM

## 2023-06-08 DIAGNOSIS — R5383 Other fatigue: Secondary | ICD-10-CM

## 2023-06-08 NOTE — Patient Instructions (Signed)

## 2023-06-08 NOTE — Progress Notes (Addendum)
Date:  06/08/2023   Name:  Barbara Cortez   DOB:  11/16/1955   MRN:  [id number]   Chief Complaint: Hypertension, Diabetes, Hyperlipidemia, and Allergies  Hypertension Pertinent negatives include no chest pain, headaches, palpitations or shortness of breath.  Diabetes Pertinent negatives for hypoglycemia include no dizziness, headaches or nervousness/anxiousness. Pertinent negatives for diabetes include no chest pain, no fatigue and no weakness.  Hyperlipidemia Associated symptoms include myalgias. Pertinent negatives include no chest pain or shortness of breath.    Lab Results  Component Value Date   NA 139 01/25/2023   K 3.9 01/25/2023   CO2 27 01/25/2023   GLUCOSE 125 (H) 01/25/2023   BUN 17 01/25/2023   CREATININE 0.74 01/25/2023   CALCIUM 9.4 01/25/2023   EGFR 82 07/24/2022   GFRNONAA >60 01/25/2023   Lab Results  Component Value Date   CHOL 224 (H) 05/28/2023   HDL 59 05/28/2023   LDLCALC 132 (H) 05/28/2023   TRIG 186 (H) 05/28/2023   Lab Results  Component Value Date   TSH 1.650 06/24/2021   Lab Results  Component Value Date   HGBA1C 6.7 (H) 05/28/2023   No results found for: "WBC", "HGB", "HCT", "MCV", "PLT" Lab Results  Component Value Date   ALT 21 07/24/2022   AST 15 07/24/2022   ALKPHOS 82 07/24/2022   BILITOT 1.2 07/24/2022   No results found for: "25OHVITD2", "25OHVITD3", "VD25OH"   Review of Systems  Constitutional: Negative.  Negative for chills, fatigue, fever and unexpected weight change.  HENT:  Negative for congestion, ear discharge, ear pain, rhinorrhea, sinus pressure, sneezing and sore throat.   Respiratory:  Negative for cough, shortness of breath, wheezing and stridor.   Cardiovascular:  Negative for chest pain, palpitations and leg swelling.  Gastrointestinal:  Negative for abdominal pain, blood in stool, constipation, diarrhea and nausea.  Genitourinary:  Negative for dysuria, flank pain, frequency, hematuria, urgency and  vaginal discharge.  Musculoskeletal:  Positive for myalgias. Negative for arthralgias and back pain.  Skin:  Negative for rash.  Neurological:  Negative for dizziness, weakness and headaches.  Hematological:  Negative for adenopathy. Does not bruise/bleed easily.  Psychiatric/Behavioral:  Negative for dysphoric mood. The patient is not nervous/anxious.     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   Lymphedema of both lower extremities 10/19/2018   Body mass index (BMI) of 35.0 to 35.9 with comorbidity 07/14/2018   Lumbosacral spondylosis with radiculopathy 07/14/2018   High risk medication use 06/19/2015   Tubular adenoma of colon 01/29/2015   Diabetes mellitus type II, controlled (HCC) 06/05/2014   Chronic insomnia 02/05/2014   Hyperlipidemia associated with type 2 diabetes mellitus (HCC) 02/05/2014   Hypertension associated with type 2 diabetes mellitus (HCC) 02/05/2014   Plantar fasciitis 02/05/2014    Allergies  Allergen Reactions   Ace Inhibitors Cough   Hydrochlorothiazide Itching    Photosensitivity    Past Surgical History:  Procedure Laterality Date   AUGMENTATION MAMMAPLASTY Bilateral 2000   COLONOSCOPY WITH PROPOFOL N/A 01/29/2015   Procedure: COLONOSCOPY WITH PROPOFOL;  Surgeon: Christena Deem, MD;  Location: [facility] ENDOSCOPY;  Service: Endoscopy;  Laterality: N/A;   Hysteroscopy wtih enodmetrial ablation     Saline Breast Implants     SEPTOPLASTY      Social History   Tobacco Use   Smoking status: Some Days    Current packs/day: 0.25    Average packs/day: 0.3 packs/day for 30.0 years (  7.5 ttl pk-yrs)    Types: Cigarettes   Smokeless tobacco: Never  Vaping Use   Vaping status: Never Used  Substance Use Topics   Alcohol use: Yes    Alcohol/week: 3.0 standard drinks of alcohol    Types: 3 Glasses of wine per week    Comment: occ   Drug use: Never     Medication list has been reviewed and updated.  Current  Meds  Medication Sig   amLODipine (NORVASC) 5 MG tablet Take 1 tablet (5 mg total) by mouth daily.   ascorbic acid (VITAMIN C) 500 MG tablet Take 500 mg by mouth daily.   Calcium Carb-Cholecalciferol (CALCIUM CARBONATE-VITAMIN D3) 600-400 MG-UNIT TABS Take 1 tablet by mouth daily.   furosemide (LASIX) 40 MG tablet Take 1 tablet (40 mg total) by mouth daily.   irbesartan (AVAPRO) 300 MG tablet Take 1 tablet (300 mg total) by mouth daily.   metFORMIN (GLUCOPHAGE-XR) 500 MG 24 hr tablet Take 1 tablet (500 mg total) by mouth daily.   metoprolol (TOPROL-XL) 200 MG 24 hr tablet Take 1 tablet (200 mg total) by mouth daily.   montelukast (SINGULAIR) 10 MG tablet TAKE 1 TABLET(10 MG) BY MOUTH AT BEDTIME   Omega-3 Fatty Acids (FISH OIL) 1000 MG CAPS Take 2 capsules by mouth daily.   simvastatin (ZOCOR) 20 MG tablet Take 1 tablet (20 mg total) by mouth daily.   Spacer/Aero-Holding Chambers (AEROCHAMBER MV) inhaler Use as instructed   traZODone (DESYREL) 50 MG tablet Take 50 mg by mouth at bedtime.       06/08/2023    8:41 AM 05/28/2023    9:00 AM 01/25/2023    9:07 AM 07/24/2022    9:19 AM  GAD 7 : Generalized Anxiety Score  Nervous, Anxious, on Edge 0 0 0 0  Control/stop worrying 0 0 0 0  Worry too much - different things 0 0 0 0  Trouble relaxing 0 0 0 0  Restless 0 0 0 0  Easily annoyed or irritable 0 0 0 0  Afraid - awful might happen 0 0 0 0  Total GAD 7 Score 0 0 0 0  Anxiety Difficulty Not difficult at all Not difficult at all Not difficult at all Not difficult at all       06/08/2023    8:41 AM 05/28/2023    8:59 AM 01/25/2023    9:07 AM  Depression screen PHQ 2/9  Decreased Interest 0 0 0  Down, Depressed, Hopeless 0 0 0  PHQ - 2 Score 0 0 0  Altered sleeping 0 0 0  Tired, decreased energy 1 0 0  Change in appetite 0 0 0  Feeling bad or failure about yourself  0 0 0  Trouble concentrating 0 0 0  Moving slowly or fidgety/restless 0 0 0  Suicidal thoughts 0 0 0  PHQ-9 Score  1 0 0  Difficult doing work/chores Not difficult at all Not difficult at all Not difficult at all    BP Readings from Last 3 Encounters:  06/08/23 134/84  05/28/23 118/76  01/25/23 128/76    Physical Exam Vitals and nursing note reviewed. Exam conducted with a chaperone present.  Constitutional:      General: She is not in acute distress.    Appearance: She is not diaphoretic.  HENT:     Head: Normocephalic and atraumatic.     Right Ear: External ear normal.     Left Ear: External ear normal.  Nose: Nose normal.  Eyes:     General:        Right eye: No discharge.        Left eye: No discharge.     Conjunctiva/sclera: Conjunctivae normal.     Pupils: Pupils are equal, round, and reactive to light.  Neck:     Thyroid: No thyromegaly.     Vascular: No JVD.  Cardiovascular:     Rate and Rhythm: Normal rate and regular rhythm.     Heart sounds: Normal heart sounds, S1 normal and S2 normal. No murmur heard.    No systolic murmur is present.     No diastolic murmur is present.     No friction rub. No gallop. No S3 or S4 sounds.  Pulmonary:     Effort: Pulmonary effort is normal.     Breath sounds: Normal breath sounds. No wheezing, rhonchi or rales.  Abdominal:     General: Bowel sounds are normal.     Palpations: Abdomen is soft. There is no mass.     Tenderness: There is no abdominal tenderness. There is no guarding.  Musculoskeletal:        General: Normal range of motion.     Cervical back: Normal range of motion and neck supple.  Lymphadenopathy:     Cervical: No cervical adenopathy.  Skin:    General: Skin is warm and dry.     Comments: Patient has a maculopapular eruption of her neck area that is consistent with a dermatitis.  We reviewed things that may cause it such as application of certain lotions and I have asked her to cut back on these and see if the rash will clear on their own in the meantime I have suggested that she use an antihistamine.  Neurological:      Mental Status: She is alert.     Deep Tendon Reflexes: Reflexes are normal and symmetric.     Wt Readings from Last 3 Encounters:  06/08/23 233 lb (105.7 kg)  05/28/23 230 lb 3.2 oz (104.4 kg)  01/25/23 218 lb (98.9 kg)    BP 134/84   Pulse 76   Ht 5\' 6"  (1.676 m)   Wt 233 lb (105.7 kg)   SpO2 95%   BMI 37.61 kg/m   Assessment and Plan:  1. Diabetes mellitus type 2, diet-controlled (HCC) Chronic.  Relatively controlled range at A1c 6.7 now.  This is diet controlled.  Stable.  However patient has noticed that this is up from prediabetic ranges and it was explained that is a combination of as we have gotten older, and developing of insulin resistance, and the reduction of insulin in her system.  Reemphasized elimination of concentrated sweets and the decrease of carbohydrate intake.  2. Hyperlipidemia associated with type 2 diabetes mellitus (HCC) Chronic.  Controlled.  Stable.  Patient currently is on simvastatin 20 mg we may have to consider discontinuing this if patient is having some myalgias which she brought up today.  But this is likely to affect the cholesterol concern.  LDL is now noted at 132 with triglycerides elevated at 186 and patient has been given a low-cholesterol low triglyceride dietary guidelines for control of this and encouraged to continue simvastatin current dose because I do not want to increase given that she is having some myalgias at this time which are episodic.  3. Hypertension associated with type 2 diabetes mellitus (HCC) Chronic.  Controlled.  Stable.  Blood pressure today 134/84.  Currently is controlled  on furosemide, metoprolol, and irbesartan as well as amlodipine 5 mg. and monitor blood pressure patient will continue on current dosings and we will recheck in next perio4. Cigarette nicotine dependence without complication Patient has been advised of the health risks of smoking and counseled concerning cessation of tobacco products. I spent over 3  minutes for discussion and to answer questions.   5. BMI 37.0-37.9, adult This was discussed with patient that I do not do weight loss medication but would prefer to approach the nutritional and the behavioral aspects of obesity and think we can have considerations of medication at a different time. - Amb Ref to Medical Weight Management  6. Malaise and fatigue Overall fatigue and malaise has continued previous labs were normal we will check a thyroid and that is been almost 2 years as well as a sed rate given that she has some myalgias and hepatic function for her liver concerns. - Thyroid Panel With TSH - Sedimentation rate - Hepatic Function Panel (6)  As noted in the HPI patient has a rash of the anterior neck area consistent with dermatitis.  This may be contact in nature but I have suggested that patient have taking a nonsedating antihistamine to take on a daily basis and to curtail her application of any lotions until the rash clears up.  Elizabeth Sauer, MD

## 2023-06-09 ENCOUNTER — Encounter: Payer: Self-pay | Admitting: Family Medicine

## 2023-06-09 LAB — SEDIMENTATION RATE: Sed Rate: 15 mm/h (ref 0–40)

## 2023-06-09 LAB — HEPATIC FUNCTION PANEL (6)
ALT: 25 [IU]/L (ref 0–32)
AST: 16 [IU]/L (ref 0–40)
Albumin: 4.3 g/dL (ref 3.9–4.9)
Alkaline Phosphatase: 101 [IU]/L (ref 44–121)
Bilirubin Total: 1.2 mg/dL (ref 0.0–1.2)
Bilirubin, Direct: 0.31 mg/dL (ref 0.00–0.40)

## 2023-06-09 LAB — THYROID PANEL WITH TSH
Free Thyroxine Index: 2 (ref 1.2–4.9)
T3 Uptake Ratio: 32 % (ref 24–39)
T4, Total: 6.4 ug/dL (ref 4.5–12.0)
TSH: 1.56 u[IU]/mL (ref 0.450–4.500)

## 2023-07-06 ENCOUNTER — Telehealth: Payer: Self-pay | Admitting: Family Medicine

## 2023-07-06 NOTE — Telephone Encounter (Signed)
Copied from CRM [id number]. Topic: Referral - Status >> Jul 06, 2023  1:23 PM Macon Large wrote: Reason for CRM: Pt requests that the referral for colonoscopy be sent to a location in [place]. Pt requests call back. Cb# [phone]

## 2023-07-06 NOTE — Telephone Encounter (Signed)
Called pt she is going to keep the appt that she has. Told pt if another referral was placed it could take up to 6 months for her to get another appt. Pt stated she did not want to wait that long and will keep her current appt.  KP

## 2023-08-25 ENCOUNTER — Ambulatory Visit: Payer: Managed Care, Other (non HMO)

## 2023-08-25 DIAGNOSIS — Z860100 Personal history of colon polyps, unspecified: Secondary | ICD-10-CM | POA: Diagnosis not present

## 2023-08-25 DIAGNOSIS — K635 Polyp of colon: Secondary | ICD-10-CM | POA: Diagnosis not present

## 2023-08-25 DIAGNOSIS — D122 Benign neoplasm of ascending colon: Secondary | ICD-10-CM | POA: Diagnosis not present

## 2023-08-25 DIAGNOSIS — K5730 Diverticulosis of large intestine without perforation or abscess without bleeding: Secondary | ICD-10-CM | POA: Diagnosis not present

## 2023-08-25 DIAGNOSIS — D128 Benign neoplasm of rectum: Secondary | ICD-10-CM | POA: Diagnosis not present

## 2023-08-25 DIAGNOSIS — D124 Benign neoplasm of descending colon: Secondary | ICD-10-CM | POA: Diagnosis not present

## 2023-08-25 DIAGNOSIS — Z1211 Encounter for screening for malignant neoplasm of colon: Secondary | ICD-10-CM | POA: Diagnosis present

## 2023-08-25 DIAGNOSIS — K640 First degree hemorrhoids: Secondary | ICD-10-CM | POA: Diagnosis not present

## 2023-10-04 ENCOUNTER — Ambulatory Visit (INDEPENDENT_AMBULATORY_CARE_PROVIDER_SITE_OTHER): Payer: Managed Care, Other (non HMO) | Admitting: Family Medicine

## 2023-10-04 ENCOUNTER — Encounter: Payer: Self-pay | Admitting: Family Medicine

## 2023-10-04 VITALS — BP 128/76 | HR 80 | Ht 66.0 in | Wt 237.0 lb

## 2023-10-04 DIAGNOSIS — E119 Type 2 diabetes mellitus without complications: Secondary | ICD-10-CM

## 2023-10-04 DIAGNOSIS — E1169 Type 2 diabetes mellitus with other specified complication: Secondary | ICD-10-CM

## 2023-10-04 DIAGNOSIS — I152 Hypertension secondary to endocrine disorders: Secondary | ICD-10-CM

## 2023-10-04 DIAGNOSIS — Z7984 Long term (current) use of oral hypoglycemic drugs: Secondary | ICD-10-CM

## 2023-10-04 DIAGNOSIS — E1159 Type 2 diabetes mellitus with other circulatory complications: Secondary | ICD-10-CM

## 2023-10-04 DIAGNOSIS — E785 Hyperlipidemia, unspecified: Secondary | ICD-10-CM

## 2023-10-04 MED ORDER — FUROSEMIDE 40 MG PO TABS
40.0000 mg | ORAL_TABLET | Freq: Every day | ORAL | 1 refills | Status: AC
Start: 2023-10-04 — End: ?

## 2023-10-04 MED ORDER — IRBESARTAN 300 MG PO TABS: 300.0000 mg | ORAL_TABLET | Freq: Every day | ORAL | 1 refills | Status: DC

## 2023-10-04 MED ORDER — METOPROLOL SUCCINATE ER 200 MG PO TB24: 200.0000 mg | ORAL_TABLET | Freq: Every day | ORAL | 1 refills | Status: DC

## 2023-10-04 MED ORDER — SIMVASTATIN 20 MG PO TABS: 20.0000 mg | ORAL_TABLET | Freq: Every day | ORAL | 1 refills | Status: DC

## 2023-10-04 MED ORDER — METFORMIN HCL ER 500 MG PO TB24: 500.0000 mg | ORAL_TABLET | Freq: Every day | ORAL | 1 refills | Status: DC

## 2023-10-04 MED ORDER — AMLODIPINE BESYLATE 5 MG PO TABS: 5.0000 mg | ORAL_TABLET | Freq: Every day | ORAL | 1 refills | Status: DC

## 2023-10-04 NOTE — Progress Notes (Signed)
 Date:  10/04/2023   Name:  Barbara Cortez   DOB:  [birth year]   MRN:  [id number]   Chief Complaint: Diabetes, Hypertension, and Hyperlipidemia  Diabetes She presents for her follow-up diabetic visit. She has type 2 diabetes mellitus. Her disease course has been stable. There are no hypoglycemic associated symptoms. Pertinent negatives for hypoglycemia include no confusion, dizziness, headaches, mood changes, nervousness/anxiousness, pallor, seizures or sweats. Associated symptoms include polydipsia and polyuria. Pertinent negatives for diabetes include no blurred vision, no chest pain, no fatigue, no foot paresthesias, no foot ulcerations, no polyphagia, no visual change, no weakness and no weight loss. There are no hypoglycemic complications. Symptoms are stable. There are no diabetic complications. Pertinent negatives for diabetic complications include no CVA. Risk factors for coronary artery disease include dyslipidemia, hypertension, diabetes mellitus and stress. When asked about current treatments, none were reported. She is compliant with treatment all of the time. Her weight is stable. Meal planning includes avoidance of concentrated sweets and carbohydrate counting. She participates in exercise intermittently. Her breakfast blood glucose is taken between 7-8 am. Her breakfast blood glucose range is generally 140-180 mg/dl. An ACE inhibitor/angiotensin II receptor blocker is being taken. Eye exam is current.  Hypertension This is a chronic problem. The current episode started more than 1 year ago. The problem has been waxing and waning since onset. The problem is controlled. Pertinent negatives include no anxiety, blurred vision, chest pain, headaches, malaise/fatigue, neck pain, orthopnea, palpitations, peripheral edema, PND, shortness of breath or sweats. Past treatments include angiotensin blockers, calcium channel blockers, beta blockers and diuretics. The current treatment provides moderate  improvement. There are no compliance problems.  There is no history of CAD/MI or CVA. There is no history of chronic renal disease.  Hyperlipidemia This is a chronic problem. The current episode started more than 1 year ago. The problem is controlled. Recent lipid tests were reviewed and are normal. She has no history of chronic renal disease, diabetes, hypothyroidism, liver disease, obesity or nephrotic syndrome. There are no known factors aggravating her hyperlipidemia. Pertinent negatives include no chest pain, focal sensory loss, focal weakness, leg pain, myalgias or shortness of breath. She is currently on no antihyperlipidemic treatment. The current treatment provides moderate improvement of lipids. There are no compliance problems.  Risk factors for coronary artery disease include diabetes mellitus, dyslipidemia and hypertension.    Lab Results  Component Value Date   NA 139 01/25/2023   K 3.9 01/25/2023   CO2 27 01/25/2023   GLUCOSE 125 (H) 01/25/2023   BUN 17 01/25/2023   CREATININE 0.74 01/25/2023   CALCIUM 9.4 01/25/2023   EGFR 82 07/24/2022   GFRNONAA >60 01/25/2023   Lab Results  Component Value Date   CHOL 224 (H) 05/28/2023   HDL 59 05/28/2023   LDLCALC 132 (H) 05/28/2023   TRIG 186 (H) 05/28/2023   Lab Results  Component Value Date   TSH 1.560 06/08/2023   Lab Results  Component Value Date   HGBA1C 6.7 (H) 05/28/2023   No results found for: "WBC", "HGB", "HCT", "MCV", "PLT" Lab Results  Component Value Date   ALT 25 06/08/2023   AST 16 06/08/2023   ALKPHOS 101 06/08/2023   BILITOT 1.2 06/08/2023   No results found for: "25OHVITD2", "25OHVITD3", "VD25OH"   Review of Systems  Constitutional:  Negative for fatigue, malaise/fatigue, unexpected weight change and weight loss.  HENT:  Negative for congestion and trouble swallowing.   Eyes:  Negative for blurred vision and  visual disturbance.  Respiratory:  Negative for shortness of breath.   Cardiovascular:   Negative for chest pain, palpitations, orthopnea and PND.  Gastrointestinal:  Negative for blood in stool, constipation and diarrhea.  Endocrine: Positive for polydipsia and polyuria. Negative for polyphagia.  Genitourinary:  Negative for hematuria and vaginal bleeding.  Musculoskeletal:  Negative for myalgias and neck pain.  Skin:  Negative for pallor.  Neurological:  Negative for dizziness, focal weakness, seizures, weakness and headaches.  Psychiatric/Behavioral:  Negative for confusion. The patient is not nervous/anxious.     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   Lymphedema of both lower extremities 10/19/2018   Body mass index (BMI) of 35.0 to 35.9 with comorbidity 07/14/2018   Lumbosacral spondylosis with radiculopathy 07/14/2018   High risk medication use 06/19/2015   Tubular adenoma of colon 01/29/2015   Diabetes mellitus type II, controlled (HCC) 06/05/2014   Chronic insomnia 02/05/2014   Hyperlipidemia associated with type 2 diabetes mellitus (HCC) 02/05/2014   Hypertension associated with type 2 diabetes mellitus (HCC) 02/05/2014   Plantar fasciitis 02/05/2014    Allergies  Allergen Reactions   Ace Inhibitors Cough   Hydrochlorothiazide Itching    Photosensitivity    Past Surgical History:  Procedure Laterality Date   AUGMENTATION MAMMAPLASTY Bilateral 2000   COLONOSCOPY WITH PROPOFOL N/A 01/29/2015   Procedure: COLONOSCOPY WITH PROPOFOL;  Surgeon: Christena Deem, MD;  Location: [facility] END[facility]COPY;  Service: Endoscopy;  Laterality: N/A;   Hysteroscopy wtih enodmetrial ablation     Saline Breast Implants     SEPTOPLASTY      Social History   Tobacco Use   Smoking status: Some Days    Current packs/day: 0.25    Average packs/day: 0.3 packs/day for 30.0 years (7.5 ttl pk-yrs)    Types: Cigarettes   Smokeless tobacco: Never  Vaping Use   Vaping status: Never Used  Substance Use Topics   Alcohol use: Yes     Alcohol/week: 3.0 standard drinks of alcohol    Types: 3 Glasses of wine per week    Comment: occ   Drug use: Never     Medication list has been reviewed and updated.  Current Meds  Medication Sig   amLODipine (NORVASC) 5 MG tablet Take 1 tablet (5 mg total) by mouth daily.   ascorbic acid (VITAMIN C) 500 MG tablet Take 500 mg by mouth daily.   Calcium Carb-Cholecalciferol (CALCIUM CARBONATE-VITAMIN D3) 600-400 MG-UNIT TABS Take 1 tablet by mouth daily.   furosemide (LASIX) 40 MG tablet Take 1 tablet (40 mg total) by mouth daily.   irbesartan (AVAPRO) 300 MG tablet Take 1 tablet (300 mg total) by mouth daily.   metFORMIN (GLUCOPHAGE-XR) 500 MG 24 hr tablet Take 1 tablet (500 mg total) by mouth daily.   metoprolol (TOPROL-XL) 200 MG 24 hr tablet Take 1 tablet (200 mg total) by mouth daily.   montelukast (SINGULAIR) 10 MG tablet TAKE 1 TABLET(10 MG) BY MOUTH AT BEDTIME   Omega-3 Fatty Acids (FISH OIL) 1000 MG CAPS Take 2 capsules by mouth daily.   simvastatin (ZOCOR) 20 MG tablet Take 1 tablet (20 mg total) by mouth daily.   Spacer/Aero-Holding Chambers (AEROCHAMBER MV) inhaler Use as instructed   traZODone (DESYREL) 50 MG tablet Take 50 mg by mouth at bedtime.       10/04/2023    8:13 AM 06/08/2023    8:41 AM 05/28/2023    9:00 AM 01/25/2023  9:07 AM  GAD 7 : Generalized Anxiety Score  Nervous, Anxious, on Edge 0 0 0 0  Control/stop worrying 0 0 0 0  Worry too much - different things 0 0 0 0  Trouble relaxing 0 0 0 0  Restless 0 0 0 0  Easily annoyed or irritable 0 0 0 0  Afraid - awful might happen 0 0 0 0  Total GAD 7 Score 0 0 0 0  Anxiety Difficulty Not difficult at all Not difficult at all Not difficult at all Not difficult at all       10/04/2023    8:12 AM 06/08/2023    8:41 AM 05/28/2023    8:59 AM  Depression screen PHQ 2/9  Decreased Interest 0 0 0  Down, Depressed, Hopeless 0 0 0  PHQ - 2 Score 0 0 0  Altered sleeping 0 0 0  Tired, decreased energy 0 1 0   Change in appetite 0 0 0  Feeling bad or failure about yourself  0 0 0  Trouble concentrating 0 0 0  Moving slowly or fidgety/restless 0 0 0  Suicidal thoughts 0 0 0  PHQ-9 Score 0 1 0  Difficult doing work/chores Not difficult at all Not difficult at all Not difficult at all    BP Readings from Last 3 Encounters:  10/04/23 128/76  06/08/23 134/84  05/28/23 118/76    Physical Exam Vitals and nursing note reviewed.  Constitutional:      General: She is not in acute distress.    Appearance: She is not diaphoretic.  HENT:     Head: Normocephalic and atraumatic.     Right Ear: Tympanic membrane, ear canal and external ear normal.     Left Ear: Tympanic membrane, ear canal and external ear normal.     Nose: Nose normal.     Mouth/Throat:     Mouth: Mucous membranes are moist.     Pharynx: No oropharyngeal exudate or posterior oropharyngeal erythema.  Eyes:     General: No scleral icterus.       Right eye: No discharge.        Left eye: No discharge.     Conjunctiva/sclera: Conjunctivae normal.     Pupils: Pupils are equal, round, and reactive to light.  Neck:     Thyroid: No thyromegaly.     Vascular: No JVD.  Cardiovascular:     Rate and Rhythm: Normal rate and regular rhythm.     Heart sounds: Normal heart sounds. No murmur heard.    No friction rub. No gallop.  Pulmonary:     Effort: Pulmonary effort is normal.     Breath sounds: Normal breath sounds. No wheezing, rhonchi or rales.  Chest:     Chest wall: No tenderness.  Abdominal:     General: Bowel sounds are normal.     Palpations: Abdomen is soft. There is no mass.     Tenderness: There is no abdominal tenderness. There is no right CVA tenderness, left CVA tenderness, guarding or rebound.  Musculoskeletal:        General: Normal range of motion.     Cervical back: Normal range of motion and neck supple.  Lymphadenopathy:     Cervical: No cervical adenopathy.  Skin:    General: Skin is warm and dry.      Capillary Refill: Capillary refill takes less than 2 seconds.     Wt Readings from Last 3 Encounters:  10/04/23 237 lb (107.5 kg)  06/08/23 233 lb (105.7 kg)  05/28/23 230 lb 3.2 oz (104.4 kg)    BP 128/76   Pulse 80   Ht 5\' 6"  (1.676 m)   Wt 237 lb (107.5 kg)   SpO2 95%   BMI 38.25 kg/m   Assessment and Plan: 1. Hypertension associated with type 2 diabetes mellitus (HCC) Chronic.  Controlled.  Stable.  Asymptomatic.  Tolerating medications well.  Today's blood pressure 128/76.  Continue amlodipine 5 mg once a day Lasix 40 mg once a day irbesartan 300 mg once a day and metoprolol XL 200 mg once a day.  Will check CMP for electrolytes and GFR.  Will recheck patient in 4 months with diabetic check. - amLODipine (NORVASC) 5 MG tablet; Take 1 tablet (5 mg total) by mouth daily.  Dispense: 90 tablet; Refill: 1 - furosemide (LASIX) 40 MG tablet; Take 1 tablet (40 mg total) by mouth daily.  Dispense: 90 tablet; Refill: 1 - irbesartan (AVAPRO) 300 MG tablet; Take 1 tablet (300 mg total) by mouth daily.  Dispense: 90 tablet; Refill: 1 - metoprolol (TOPROL-XL) 200 MG 24 hr tablet; Take 1 tablet (200 mg total) by mouth daily.  Dispense: 90 tablet; Refill: 1 - Comprehensive metabolic panel  2. Controlled type 2 diabetes mellitus without complication, without long-term current use of insulin (HCC) (Primary) Chronic.  Controlled.  Stable.  Asymptomatic.  Tolerating medications well.  Currently on metformin XR 500 mg once a day.  Patient states that she is having some polyuria polydipsia and there was a blood fasting sugar of 150 earlier past week.  So if things remain elevated may consider the possibility of a GLP-1 such as Ozempic if remains elevated.  In the meantime we will check a CMP as well as hemoglobin A1c for current status of control. - metFORMIN (GLUCOPHAGE-XR) 500 MG 24 hr tablet; Take 1 tablet (500 mg total) by mouth daily.  Dispense: 90 tablet; Refill: 1 - Comprehensive metabolic  panel - Hemoglobin A1c - Microalbumin / creatinine urine ratio  3. Hyperlipidemia associated with type 2 diabetes mellitus (HCC) Chronic.  Controlled.  Stable.  Asymptomatic.  Without myalgias without muscle weakness.  Continue simvastatin 20 mg once a day.  Will check lipid panel for current level of LDL control in the meantime I have provided guidelines for cholesterol and triglyceride. - simvastatin (ZOCOR) 20 MG tablet; Take 1 tablet (20 mg total) by mouth daily.  Dispense: 90 tablet; Refill: 1 - Lipid Panel With LDL/HDL Ratio     Elizabeth Sauer, MD

## 2023-10-04 NOTE — Patient Instructions (Signed)

## 2023-10-06 LAB — MICROALBUMIN / CREATININE URINE RATIO
Creatinine, Urine: 119.4 mg/dL
Microalb/Creat Ratio: 20 mg/g{creat} (ref 0–29)
Microalbumin, Urine: 23.7 ug/mL

## 2023-10-06 LAB — COMPREHENSIVE METABOLIC PANEL
ALT: 25 IU/L (ref 0–32)
AST: 21 IU/L (ref 0–40)
Albumin: 4.4 g/dL (ref 3.9–4.9)
Alkaline Phosphatase: 110 IU/L (ref 44–121)
BUN/Creatinine Ratio: 21 (ref 12–28)
BUN: 15 mg/dL (ref 8–27)
Bilirubin Total: 1.3 mg/dL — ABNORMAL HIGH (ref 0.0–1.2)
CO2: 26 mmol/L (ref 20–29)
Calcium: 10.5 mg/dL — ABNORMAL HIGH (ref 8.7–10.3)
Chloride: 102 mmol/L (ref 96–106)
Creatinine, Ser: 0.7 mg/dL (ref 0.57–1.00)
Globulin, Total: 2.2 g/dL (ref 1.5–4.5)
Glucose: 127 mg/dL — ABNORMAL HIGH (ref 70–99)
Potassium: 5.1 mmol/L (ref 3.5–5.2)
Sodium: 143 mmol/L (ref 134–144)
Total Protein: 6.6 g/dL (ref 6.0–8.5)
eGFR: 95 mL/min/{1.73_m2} (ref >59)

## 2023-10-06 LAB — HEMOGLOBIN A1C
Est. average glucose Bld gHb Est-mCnc: 143 mg/dL
Hgb A1c MFr Bld: 6.6 % — ABNORMAL HIGH (ref 4.8–5.6)

## 2023-10-06 LAB — LIPID PANEL WITH LDL/HDL RATIO
Cholesterol, Total: 208 mg/dL — ABNORMAL HIGH (ref 100–199)
HDL: 57 mg/dL (ref >39)
LDL Chol Calc (NIH): 122 mg/dL — ABNORMAL HIGH (ref 0–99)
LDL/HDL Ratio: 2.1 ratio (ref 0.0–3.2)
Triglycerides: 165 mg/dL — ABNORMAL HIGH (ref 0–149)
VLDL Cholesterol Cal: 29 mg/dL (ref 5–40)

## 2023-10-07 ENCOUNTER — Encounter: Payer: Self-pay | Admitting: Family Medicine

## 2023-12-30 ENCOUNTER — Other Ambulatory Visit: Payer: Self-pay | Admitting: Family Medicine

## 2023-12-30 DIAGNOSIS — Z1231 Encounter for screening mammogram for malignant neoplasm of breast: Secondary | ICD-10-CM

## 2024-02-03 ENCOUNTER — Encounter: Payer: Self-pay | Admitting: Student

## 2024-02-03 ENCOUNTER — Ambulatory Visit: Admitting: Student

## 2024-02-03 VITALS — BP 134/88 | HR 81 | Ht 66.0 in | Wt 238.0 lb

## 2024-02-03 DIAGNOSIS — Z720 Tobacco use: Secondary | ICD-10-CM

## 2024-02-03 DIAGNOSIS — I890 Lymphedema, not elsewhere classified: Secondary | ICD-10-CM

## 2024-02-03 DIAGNOSIS — E785 Hyperlipidemia, unspecified: Secondary | ICD-10-CM

## 2024-02-03 DIAGNOSIS — M85852 Other specified disorders of bone density and structure, left thigh: Secondary | ICD-10-CM

## 2024-02-03 DIAGNOSIS — Z7984 Long term (current) use of oral hypoglycemic drugs: Secondary | ICD-10-CM

## 2024-02-03 DIAGNOSIS — E119 Type 2 diabetes mellitus without complications: Secondary | ICD-10-CM | POA: Diagnosis not present

## 2024-02-03 DIAGNOSIS — M85859 Other specified disorders of bone density and structure, unspecified thigh: Secondary | ICD-10-CM | POA: Insufficient documentation

## 2024-02-03 DIAGNOSIS — E66812 Obesity, class 2: Secondary | ICD-10-CM

## 2024-02-03 DIAGNOSIS — D126 Benign neoplasm of colon, unspecified: Secondary | ICD-10-CM

## 2024-02-03 DIAGNOSIS — I152 Hypertension secondary to endocrine disorders: Secondary | ICD-10-CM

## 2024-02-03 DIAGNOSIS — E1159 Type 2 diabetes mellitus with other circulatory complications: Secondary | ICD-10-CM | POA: Diagnosis not present

## 2024-02-03 DIAGNOSIS — E669 Obesity, unspecified: Secondary | ICD-10-CM | POA: Insufficient documentation

## 2024-02-03 DIAGNOSIS — F5104 Psychophysiologic insomnia: Secondary | ICD-10-CM

## 2024-02-03 DIAGNOSIS — Z6838 Body mass index (BMI) 38.0-38.9, adult: Secondary | ICD-10-CM

## 2024-02-03 MED ORDER — ROSUVASTATIN CALCIUM 20 MG PO TABS
20.0000 mg | ORAL_TABLET | Freq: Every day | ORAL | 3 refills | Status: DC
Start: 2024-02-03 — End: 2024-05-09

## 2024-02-03 NOTE — Progress Notes (Signed)
 Established Patient Office Visit  Subjective   Patient ID: Barbara Cortez, female    DOB: Jul 23, 1956  Age: 68 y.o. MRN: [id number]  Chief Complaint  Patient presents with   Hypertension   Barbara Cortez is a 68 y.o. person who presents today for transfer of care. PCP was Dr. Joshua who recently retired. Patient is going to retire in 4 weeks and planning to start exercising and eating better.   Patient Active Problem List   Diagnosis Date Noted   Obesity 02/03/2024   Tobacco use 02/03/2024   Osteopenia of femoral neck 02/03/2024   Lymphedema of both lower extremities 10/19/2018   Lumbosacral spondylosis with radiculopathy 07/14/2018   Tubular adenoma of colon 01/29/2015   Diabetes mellitus type II, controlled (HCC) 06/05/2014   Chronic insomnia 02/05/2014   HLD (hyperlipidemia) 02/05/2014   Hypertension associated with type 2 diabetes mellitus (HCC) 02/05/2014      ROS Refer to HPI    Objective:     BP 134/88   Pulse 81   Ht 5' 6 (1.676 m)   Wt 238 lb (108 kg)   SpO2 94%   BMI 38.41 kg/m  BP Readings from Last 3 Encounters:  02/03/24 134/88  10/04/23 128/76  06/08/23 134/84    Physical Exam Constitutional:      Appearance: She is obese.  HENT:     Mouth/Throat:     Mouth: Mucous membranes are moist.     Pharynx: Oropharynx is clear.  Cardiovascular:     Rate and Rhythm: Normal rate and regular rhythm.  Pulmonary:     Effort: Pulmonary effort is normal.     Breath sounds: No rhonchi or rales.  Abdominal:     General: Abdomen is flat. Bowel sounds are normal. There is no distension.     Palpations: Abdomen is soft.     Tenderness: There is no abdominal tenderness.  Musculoskeletal:        General: Normal range of motion.     Right lower leg: No edema.     Left lower leg: No edema.  Skin:    General: Skin is warm and dry.     Capillary Refill: Capillary refill takes less than 2 seconds.  Neurological:     General: No focal deficit present.     Mental  Status: She is alert and oriented to person, place, and time.  Psychiatric:        Mood and Affect: Mood normal.        Behavior: Behavior normal.        02/03/2024    8:15 AM 10/04/2023    8:12 AM 06/08/2023    8:41 AM  Depression screen PHQ 2/9  Decreased Interest 0 0 0  Down, Depressed, Hopeless 0 0 0  PHQ - 2 Score 0 0 0  Altered sleeping  0 0  Tired, decreased energy  0 1  Change in appetite  0 0  Feeling bad or failure about yourself   0 0  Trouble concentrating  0 0  Moving slowly or fidgety/restless  0 0  Suicidal thoughts  0 0  PHQ-9 Score  0 1  Difficult doing work/chores  Not difficult at all Not difficult at all       02/03/2024    8:16 AM 10/04/2023    8:13 AM 06/08/2023    8:41 AM 05/28/2023    9:00 AM  GAD 7 : Generalized Anxiety Score  Nervous, Anxious, on Edge 0 0 0 0  Control/stop worrying 0 0 0 0  Worry too much - different things 0 0 0 0  Trouble relaxing 0 0 0 0  Restless 0 0 0 0  Easily annoyed or irritable 0 0 0 0  Afraid - awful might happen 0 0 0 0  Total GAD 7 Score 0 0 0 0  Anxiety Difficulty Not difficult at all Not difficult at all Not difficult at all Not difficult at all    No results found for any visits on 02/03/24.  Last metabolic panel Lab Results  Component Value Date   GLUCOSE 127 (H) 10/04/2023   NA 143 10/04/2023   K 5.1 10/04/2023   CL 102 10/04/2023   CO2 26 10/04/2023   BUN 15 10/04/2023   CREATININE 0.70 10/04/2023   EGFR 95 10/04/2023   CALCIUM 10.5 (H) 10/04/2023   PHOS 3.8 01/25/2023   PROT 6.6 10/04/2023   ALBUMIN 4.4 10/04/2023   LABGLOB 2.2 10/04/2023   AGRATIO 1.9 07/24/2022   BILITOT 1.3 (H) 10/04/2023   ALKPHOS 110 10/04/2023   AST 21 10/04/2023   ALT 25 10/04/2023   ANIONGAP 10 01/25/2023   Last lipids Lab Results  Component Value Date   CHOL 208 (H) 10/04/2023   HDL 57 10/04/2023   LDLCALC 122 (H) 10/04/2023   TRIG 165 (H) 10/04/2023   Last thyroid  functions Lab Results  Component Value Date    TSH 1.560 06/08/2023   T4TOTAL 6.4 06/08/2023      The 10-year ASCVD risk score (Arnett DK, et al., 2019) is: 22%    Assessment & Plan:  Hypertension associated with type 2 diabetes mellitus (HCC) Assessment & Plan: Amlodipine  5 mg, irbesartan  300 mg, metoprolol  200 mg daily, and lasix  40 mg  daily. BP is 134/88 today. She is planning to work on weight loss and exercising regularly which I anticipate will improve BP.    Class 2 severe obesity due to excess calories with serious comorbidity and body mass index (BMI) of 38.0 to 38.9 in adult [facility]) Assessment & Plan: Would like to lose weight. Has a gym membership. Was going to water aerobics but has not been going recently. Is going to retire soon. She has goal to make significant life changes including meal prepping. She also plans to start walking in the morning around neighborhood for 40 minutes and go to the gym 3 times a week. Sh is going to do water aerobics and strength classes.   Controlled type 2 diabetes mellitus without complication, without long-term current use of insulin (HCC) Assessment & Plan: A1c is 6.9% today. Currently well controlled on Metformin  500 mg once a day. Continue current medication. She is interested in weight loss will monitor A1c if rising may benefit from GLP-1.    Tobacco use Assessment & Plan: Is still smoking intermittently about 5-6 cigarettes daily. Has ~20 pack year history. She is motivated to quit. Declined medical or behavioral therapy. Thinks she will be more successful once she is retired next month. She is interested in lung cancer screening,  refer for low dose CT.    Orders: -     Ambulatory Referral for Lung Cancer Scre  Tubular adenoma of colon Assessment & Plan: TA noted on colonoscopy on 08/2023. Next due in 3 year.   Hyperlipidemia, unspecified hyperlipidemia type Assessment & Plan: Lipid Panel     Component Value Date/Time   CHOL 208 (H) 10/04/2023 1005   TRIG 165 (H)  10/04/2023 1005   HDL 57 10/04/2023  1005   LDLCALC 122 (H) 10/04/2023 1005   LABVLDL 29 10/04/2023 1005   The 10-year ASCVD risk score (Arnett DK, et al., 2019) is: 22%. May benefit from switching to a high intensity statin. She just picked up simvastatin  today, will switch to rosuvastatin 20 mg daily once she is due for refill.     Chronic insomnia Assessment & Plan: Sleeping well no longer needing trazodone. Discontinue trazodone. She will let me know if she starts having difficulty sleeping.   Osteopenia of neck of left femur Assessment & Plan: Is supplementing calcium and vitamin D3, encouraged weight bearing exercises.    Long term current use of oral hypoglycemic drug  Lymphedema of both lower extremities Assessment & Plan: Mild edema on exam, recommend wearing compression stocking and sits at desk for work. Continue lasix  40 mg daily.       Return in about 3 months (around 05/05/2024) for DM, HTN.    Harlene Saddler, MD

## 2024-02-03 NOTE — Assessment & Plan Note (Signed)
 Mild edema on exam, recommend wearing compression stocking and sits at desk for work. Continue lasix  40 mg daily.

## 2024-02-03 NOTE — Assessment & Plan Note (Addendum)
 Would like to lose weight. Has a gym membership. Was going to water aerobics but has not been going recently. Is going to retire soon. She has goal to make significant life changes including meal prepping. She also plans to start walking in the morning around neighborhood for 40 minutes and go to the gym 3 times a week. Sh is going to do water aerobics and strength classes.

## 2024-02-03 NOTE — Assessment & Plan Note (Addendum)
 A1c is 6.9% today. Currently well controlled on Metformin  500 mg once a day. Continue current medication. She is interested in weight loss will monitor A1c if rising may benefit from GLP-1.

## 2024-02-03 NOTE — Assessment & Plan Note (Addendum)
 Lipid Panel     Component Value Date/Time   CHOL 208 (H) 10/04/2023 1005   TRIG 165 (H) 10/04/2023 1005   HDL 57 10/04/2023 1005   LDLCALC 122 (H) 10/04/2023 1005   LABVLDL 29 10/04/2023 1005   The 10-year ASCVD risk score (Arnett DK, et al., 2019) is: 22%. May benefit from switching to a high intensity statin. She just picked up simvastatin  today, will switch to rosuvastatin 20 mg daily once she is due for refill.

## 2024-02-03 NOTE — Assessment & Plan Note (Addendum)
 TA noted on colonoscopy on 08/2023. Next due in 3 year.

## 2024-02-03 NOTE — Assessment & Plan Note (Signed)
 Is supplementing calcium and vitamin D3, encouraged weight bearing exercises.

## 2024-02-03 NOTE — Assessment & Plan Note (Addendum)
 Amlodipine  5 mg, irbesartan  300 mg, metoprolol  200 mg daily, and lasix  40 mg  daily. BP is 134/88 today. She is planning to work on weight loss and exercising regularly which I anticipate will improve BP.

## 2024-02-03 NOTE — Assessment & Plan Note (Addendum)
 Sleeping well no longer needing trazodone. Discontinue trazodone. She will let me know if she starts having difficulty sleeping.

## 2024-02-03 NOTE — Assessment & Plan Note (Addendum)
 Is still smoking intermittently about 5-6 cigarettes daily. Has ~20 pack year history. She is motivated to quit. Declined medical or behavioral therapy. Thinks she will be more successful once she is retired next month. She is interested in lung cancer screening,  refer for low dose CT.

## 2024-02-07 ENCOUNTER — Other Ambulatory Visit: Payer: Self-pay | Admitting: Student

## 2024-02-07 ENCOUNTER — Other Ambulatory Visit: Payer: Self-pay | Admitting: Family Medicine

## 2024-02-07 ENCOUNTER — Ambulatory Visit
Admission: RE | Admit: 2024-02-07 | Discharge: 2024-02-07 | Disposition: A | Discharge status: Home / Self Care | Source: Ambulatory Visit | Attending: Family Medicine | Admitting: Family Medicine

## 2024-02-07 DIAGNOSIS — Z1231 Encounter for screening mammogram for malignant neoplasm of breast: Secondary | ICD-10-CM

## 2024-02-17 ENCOUNTER — Telehealth: Payer: Self-pay | Admitting: Acute Care

## 2024-02-17 DIAGNOSIS — Z122 Encounter for screening for malignant neoplasm of respiratory organs: Secondary | ICD-10-CM

## 2024-02-17 DIAGNOSIS — F17210 Nicotine dependence, cigarettes, uncomplicated: Secondary | ICD-10-CM

## 2024-02-17 DIAGNOSIS — Z87891 Personal history of nicotine dependence: Secondary | ICD-10-CM

## 2024-02-17 NOTE — Telephone Encounter (Signed)
 Lung Cancer Screening Narrative/Criteria Questionnaire (Cigarette Smokers Only- No Cigars/Pipes/vapes)   Stefannie Defeo   SDMV:02/22/2024 12:00p [place]      1955/09/27   LDCT: 03/06/2024 10:00a [place]      68 y.o.   Phone: [phone]  Lung Screening Narrative (confirm age 70-77 yrs Medicare / 50-80 yrs Private pay insurance)   Insurance information:[employer]   Referring Provider:Dr. Lemon PCP   This screening involves an initial phone call with a team member from our program. It is called a shared decision making visit. The initial meeting is required by  insurance and Medicare to make sure you understand the program. This appointment takes about 15-20 minutes to complete. You will complete the screening scan at your scheduled date/time.  This scan takes about 5-10 minutes to complete. You can eat and drink normally before and after the scan.  Criteria questions for Lung Cancer Screening:   Are you a current or former smoker? Current Age began smoking: 68yo   If you are a former smoker, what year did you quit smoking? N/A(within 15 yrs)   To calculate your smoking history, I need an accurate estimate of how many packs of cigarettes you smoked per day and for how many years. (Not just the number of PPD you are now smoking)   Years smoking 50 x Packs per day 3/4 = Pack years 37.5   (at least 20 pack yrs)   (Make sure they understand that we need to know how much they have smoked in the past, not just the number of PPD they are smoking now)  Do you have a personal history of cancer?  No    Do you have a family history of cancer? Yes  (cancer type and and relative) Brother - throat   Are you coughing up blood?  No  Have you had unexplained weight loss of 15 lbs or more in the last 6 months? No  It looks like you meet all criteria.  When would be a good time for us  to schedule you for this screening?   Additional information: N/A

## 2024-02-22 ENCOUNTER — Ambulatory Visit: Admitting: Acute Care

## 2024-02-22 DIAGNOSIS — F17210 Nicotine dependence, cigarettes, uncomplicated: Secondary | ICD-10-CM

## 2024-02-22 DIAGNOSIS — Z122 Encounter for screening for malignant neoplasm of respiratory organs: Secondary | ICD-10-CM

## 2024-02-22 NOTE — Progress Notes (Signed)
 Virtual Visit via Telephone Note  I connected with Barbara Cortez on 02/22/24 at 12:00 PM EDT by telephone and verified that I am speaking with the correct person using two identifiers.  Location: Patient: Barbara Cortez Provider: Laneta Speaks, RN   I discussed the limitations, risks, security and privacy concerns of performing an evaluation and management service by telephone and the availability of in person appointments. I also discussed with the patient that there may be a patient responsible charge related to this service. The patient expressed understanding and agreed to proceed.   Shared Decision Making Visit Lung Cancer Screening Program [phone])   Eligibility: Age 68 y.o. Pack Years Smoking History Calculation 37.5 (# packs/per year x # years smoked) Recent History of coughing up blood  no Unexplained weight loss? no ( >Than 15 pounds within the last 6 months ) Prior History Lung / other cancer no (Diagnosis within the last 5 years already requiring surveillance chest CT Scans). Smoking Status Current Smoker Former Smokers: Years since quit: n/a  Quit Date: n/a  Visit Components: Discussion included one or more decision making aids. yes Discussion included risk/benefits of screening. yes Discussion included potential follow up diagnostic testing for abnormal scans. yes Discussion included meaning and risk of over diagnosis. yes Discussion included meaning and risk of False Positives. yes Discussion included meaning of total radiation exposure. yes  Counseling Included: Importance of adherence to annual lung cancer LDCT screening. yes Impact of comorbidities on ability to participate in the program. yes Ability and willingness to under diagnostic treatment. yes  Smoking Cessation Counseling: Current Smokers:  Discussed importance of smoking cessation. yes Information about tobacco cessation classes and interventions provided to patient. yes Patient provided with  ticket for LDCT Scan. no Symptomatic Patient. no  Counseling(Intermediate counseling: > three minutes) 99406 Diagnosis Code: Tobacco Use Z72.0 Asymptomatic Patient yes  Counseling (Intermediate counseling: > three minutes counseling) [id number] Former Smokers:  Discussed the importance of maintaining cigarette abstinence. yes Diagnosis Code: Personal History of Nicotine Dependence. S12.108 Information about tobacco cessation classes and interventions provided to patient. Yes Patient provided with ticket for LDCT Scan. no Written Order for Lung Cancer Screening with LDCT placed in Epic. Yes (CT Chest Lung Cancer Screening Low Dose W/O CM) [id number] Z12.2-Screening of respiratory organs Z87.891-Personal history of nicotine dependence   Laneta Speaks, RN

## 2024-02-22 NOTE — Patient Instructions (Signed)

## 2024-03-06 ENCOUNTER — Ambulatory Visit
Admission: RE | Admit: 2024-03-06 | Discharge: 2024-03-06 | Disposition: A | Discharge status: Home / Self Care | Source: Ambulatory Visit | Attending: Acute Care | Admitting: Acute Care

## 2024-03-06 DIAGNOSIS — F17210 Nicotine dependence, cigarettes, uncomplicated: Secondary | ICD-10-CM

## 2024-03-06 DIAGNOSIS — Z122 Encounter for screening for malignant neoplasm of respiratory organs: Secondary | ICD-10-CM

## 2024-03-06 DIAGNOSIS — Z87891 Personal history of nicotine dependence: Secondary | ICD-10-CM

## 2024-03-17 ENCOUNTER — Other Ambulatory Visit: Payer: Self-pay | Admitting: Acute Care

## 2024-03-17 DIAGNOSIS — Z87891 Personal history of nicotine dependence: Secondary | ICD-10-CM

## 2024-03-17 DIAGNOSIS — Z122 Encounter for screening for malignant neoplasm of respiratory organs: Secondary | ICD-10-CM

## 2024-03-17 DIAGNOSIS — F17210 Nicotine dependence, cigarettes, uncomplicated: Secondary | ICD-10-CM

## 2024-04-05 IMAGING — MG DIGITAL SCREENING BREAST BILAT IMPLANT W/ TOMO W/ CAD
8 of 12 series · 8 of 28 positions shown · non-contrast
Comparison: Comparison is made with multiple prior studies.

CLINICAL DATA: Screening.

EXAM:
DIGITAL SCREENING BILATERAL MAMMOGRAM WITH IMPLANTS, CAD AND
TOMOSYNTHESIS
TECHNIQUE: Bilateral screening digital craniocaudal and mediolateral oblique
mammograms were obtained. Bilateral screening digital breast
tomosynthesis was performed. The images were evaluated with
computer-aided detection. Standard and/or implant displaced views
were performed.

[L CC]
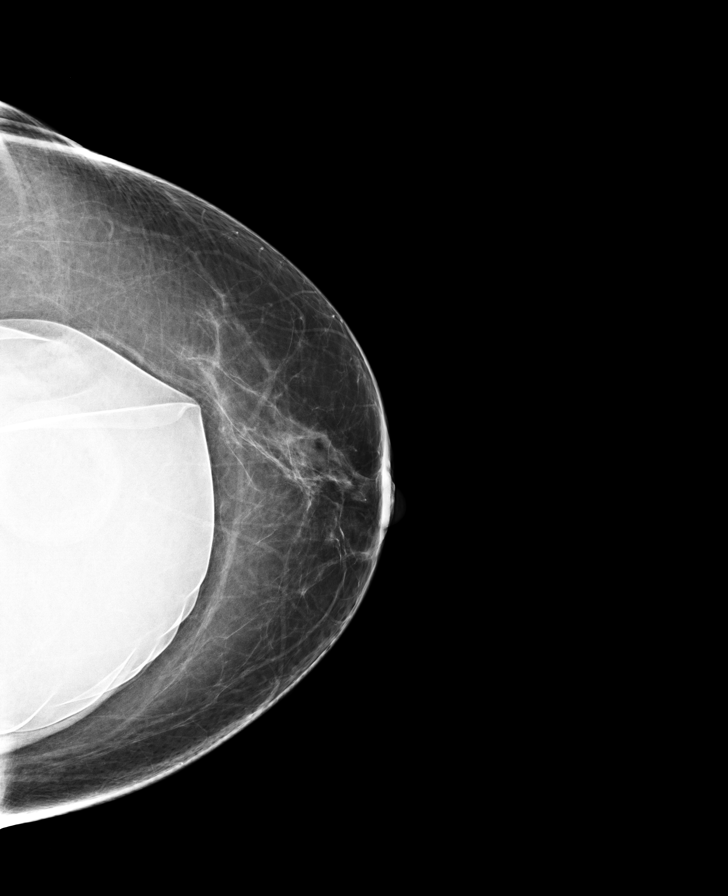

[R CC]
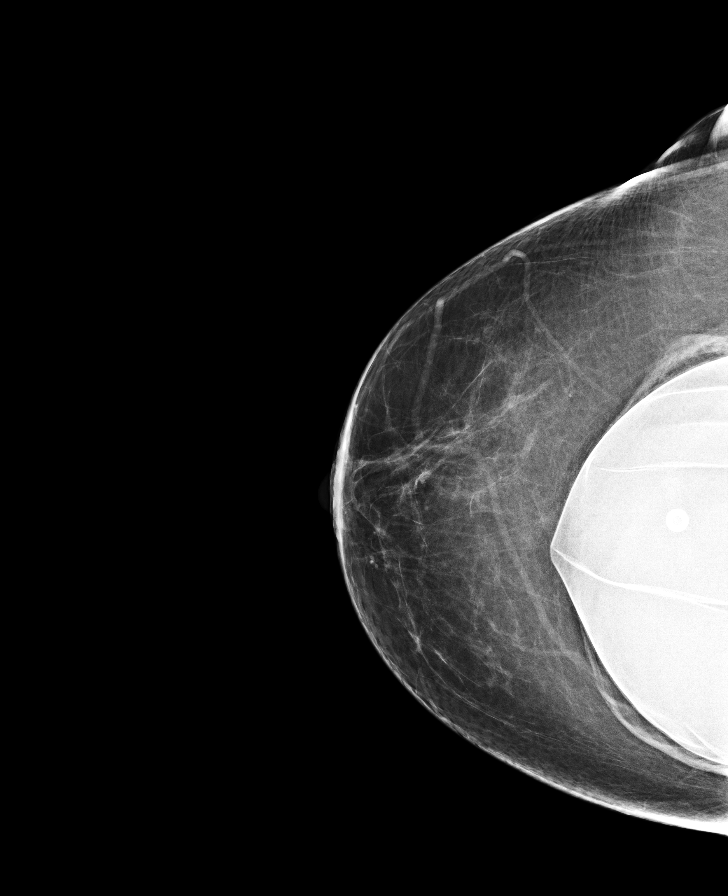

[L MLO]
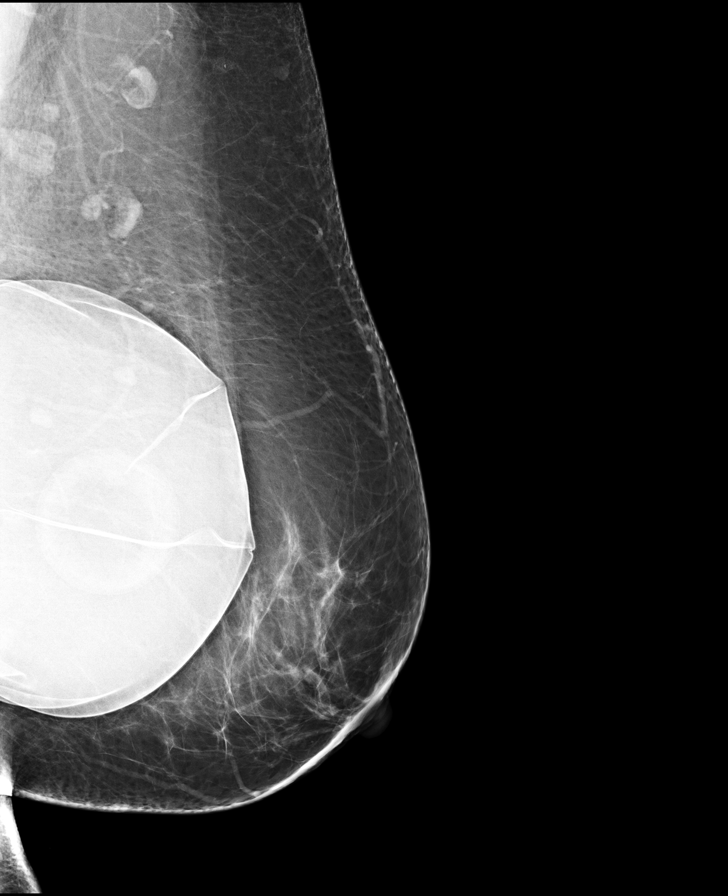

[R MLO]
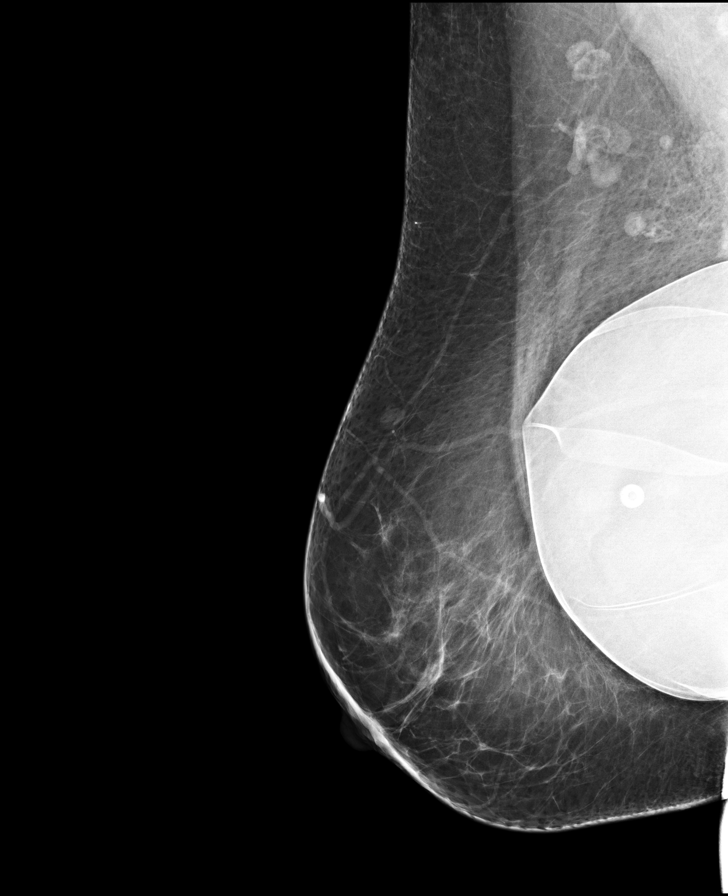

[R CC synth-2D]
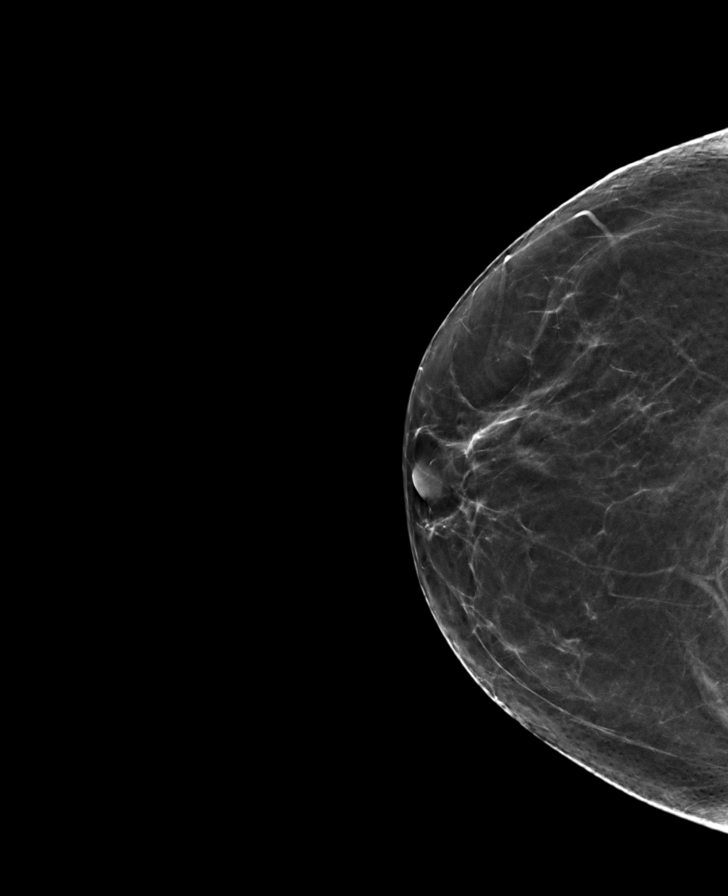

[L CC synth-2D]
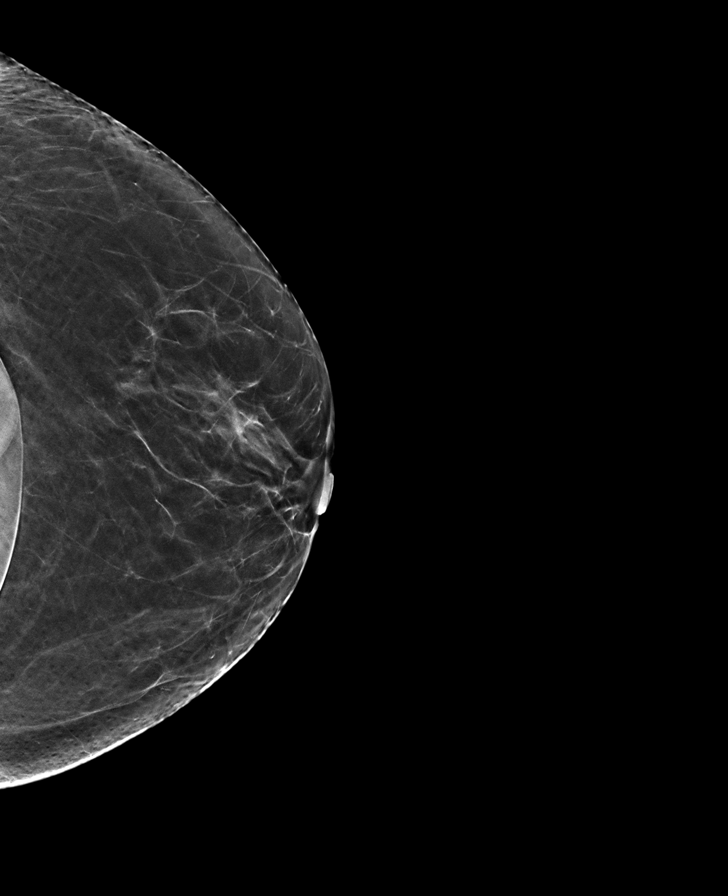

[L MLO synth-2D]
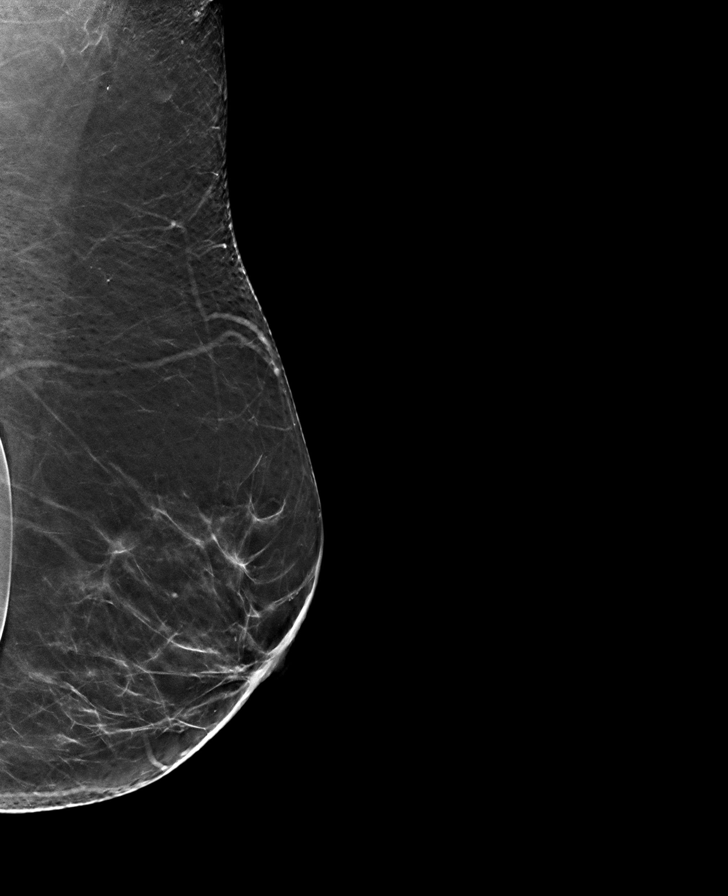

[R MLO synth-2D]
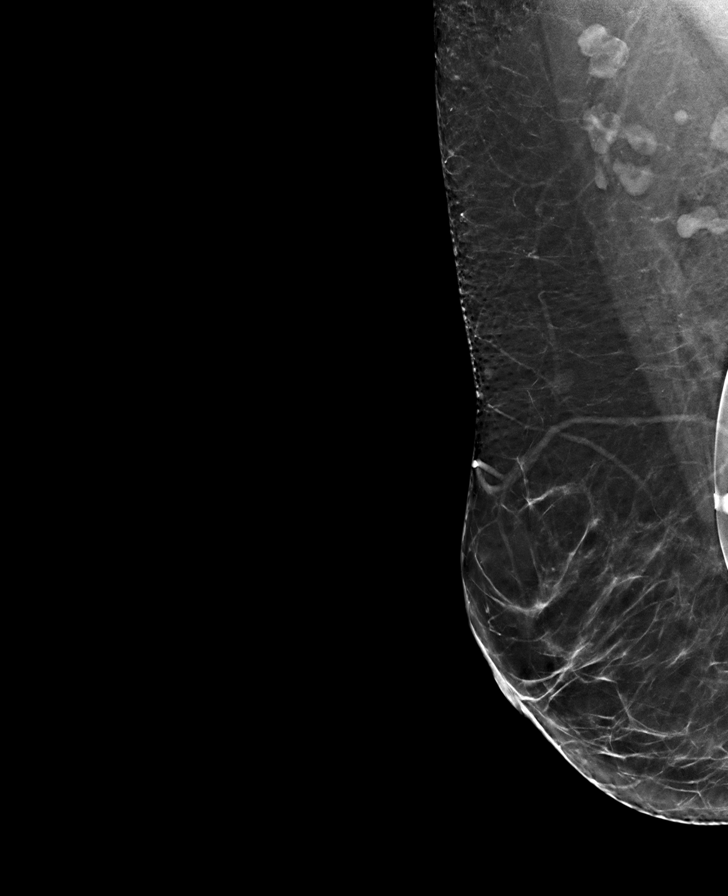

[8 of 28 positions shown; findings below may reference images not displayed]

ACR Breast Density Category b: There are scattered areas of
fibroglandular density.
FINDINGS: The patient has retropectoral implants. There are no findings
suspicious for malignancy.
IMPRESSION: No mammographic evidence of malignancy. A result letter of this
screening mammogram will be mailed directly to the patient.

RECOMMENDATION:
Screening mammogram in one year. (Code:UY-6-Z1P)

BI-RADS CATEGORY  1:  Negative.

## 2024-04-09 ENCOUNTER — Encounter: Payer: Self-pay | Admitting: Emergency Medicine

## 2024-04-09 ENCOUNTER — Ambulatory Visit
Admission: EM | Admit: 2024-04-09 | Discharge: 2024-04-09 | Disposition: A | Discharge status: Home / Self Care | Attending: Family Medicine | Admitting: Family Medicine

## 2024-04-09 DIAGNOSIS — J441 Chronic obstructive pulmonary disease with (acute) exacerbation: Secondary | ICD-10-CM

## 2024-04-09 DIAGNOSIS — I10 Essential (primary) hypertension: Secondary | ICD-10-CM

## 2024-04-09 DIAGNOSIS — S161XXA Strain of muscle, fascia and tendon at neck level, initial encounter: Secondary | ICD-10-CM | POA: Diagnosis not present

## 2024-04-09 MED ORDER — AZITHROMYCIN 250 MG PO TABS: ORAL_TABLET | ORAL | 0 refills | Status: DC

## 2024-04-09 MED ORDER — PREDNISONE 50 MG PO TABS: 50.0000 mg | ORAL_TABLET | Freq: Every day | ORAL | 0 refills | Status: AC

## 2024-04-09 MED ORDER — METHOCARBAMOL 500 MG PO TABS: 500.0000 mg | ORAL_TABLET | Freq: Two times a day (BID) | ORAL | 0 refills | Status: DC

## 2024-04-09 NOTE — ED Provider Notes (Signed)
 [facility]    CSN: [id number] Arrival date & time: 04/09/24  1027      History   Chief Complaint Chief Complaint  Patient presents with   Cough   Nasal Congestion    HPI Barbara Cortez is a 68 y.o. female.   HPI  History obtained from the patient. Barbara Cortez presents for productive cough with chest congestion, nasal congestion and ear clogging for the past 3 weeks. Has intermittent headache. Has pain when she blows her nose.  No fever, vomiting, diarrhea or sore throat.  Tried everything over the counter but nothing is helping.  Laying flat makes her symptoms worse. She has not been able to sleep due to coughing.  She has COPD but has not been able to smoke but it makes it hard to breathe. She uses Afrin every night to help her breathe. She was told to stop this previously.    Stiff neck for the past 2 days after waking up. Feels like she slept wrong.  Pain radiates to her shoulder and up towards her head. Has trouble turning her head.  No falls, trauma or injuries. Tried heating pad, icing it, handheld  massagers and ibuprofen but nothing is touching it.  Denies history of neck pain.     Past Medical History:  Diagnosis Date   Anxiety    Broken arm, left, closed, initial encounter    Chronic insomnia    COPD (chronic obstructive pulmonary disease) (HCC)    Diabetes mellitus without complication (HCC)    Diverticulosis    Hypertension    Impaired glucose tolerance    Internal hemorrhoids     Patient Active Problem List   Diagnosis Date Noted   Obesity 02/03/2024   Tobacco use 02/03/2024   Osteopenia of femoral neck 02/03/2024   Lymphedema of both lower extremities 10/19/2018   Lumbosacral spondylosis with radiculopathy 07/14/2018   Tubular adenoma of colon 01/29/2015   Diabetes mellitus type II, controlled (HCC) 06/05/2014   Chronic insomnia 02/05/2014   HLD (hyperlipidemia) 02/05/2014   Hypertension associated with type 2 diabetes mellitus (HCC) 02/05/2014     Past Surgical History:  Procedure Laterality Date   AUGMENTATION MAMMAPLASTY Bilateral 2000   COLONOSCOPY WITH PROPOFOL  N/A 01/29/2015   Procedure: COLONOSCOPY WITH PROPOFOL ;  Surgeon: Gladis RAYMOND Mariner, MD;  Location: [facility] ENDOSCOPY;  Service: Endoscopy;  Laterality: N/A;   Hysteroscopy wtih enodmetrial ablation     Saline Breast Implants     SEPTOPLASTY      OB History   No obstetric history on file.      Home Medications    Prior to Admission medications   Medication Sig Start Date End Date Taking? Authorizing Provider  azithromycin  (ZITHROMAX  Z-PAK) 250 MG tablet Take 2 tablets on day 1 then 1 tablet daily 04/09/24  Yes Gurjit Loconte, DO  methocarbamol  (ROBAXIN ) 500 MG tablet Take 1 tablet (500 mg total) by mouth 2 (two) times daily. 04/09/24  Yes Teyon Odette, DO  predniSONE  (DELTASONE ) 50 MG tablet Take 1 tablet (50 mg total) by mouth daily for 5 days. 04/09/24 04/14/24 Yes Marquisha Nikolov, DO  amLODipine  (NORVASC ) 5 MG tablet Take 1 tablet (5 mg total) by mouth daily. 10/04/23   Joshua Cathryne BROCKS, MD  ascorbic acid (VITAMIN C) 500 MG tablet Take 500 mg by mouth daily.    [provider]  Calcium  Carb-Cholecalciferol (CALCIUM  CARBONATE-VITAMIN D3) 600-400 MG-UNIT TABS Take 1 tablet by mouth daily.    [provider]  furosemide  (LASIX )  40 MG tablet Take 1 tablet (40 mg total) by mouth daily. 10/04/23   Joshua Cathryne BROCKS, MD  irbesartan  (AVAPRO ) 300 MG tablet Take 1 tablet (300 mg total) by mouth daily. 10/04/23   Joshua Cathryne BROCKS, MD  metFORMIN  (GLUCOPHAGE -XR) 500 MG 24 hr tablet Take 1 tablet (500 mg total) by mouth daily. 10/04/23   Joshua Cathryne BROCKS, MD  metoprolol  (TOPROL -XL) 200 MG 24 hr tablet Take 1 tablet (200 mg total) by mouth daily. 10/04/23   Joshua Cathryne BROCKS, MD  Omega-3 Fatty Acids (FISH OIL) 1000 MG CAPS Take 2 capsules by mouth daily.    [provider]  rosuvastatin  (CRESTOR ) 20 MG tablet Take 1 tablet (20 mg total) by mouth daily. 02/03/24   Lemon Raisin, MD    Family History Family History  Problem Relation Age of Onset   Hypertension Mother    Hypertension Father    Stroke Father    Throat cancer Brother    Breast cancer Maternal Aunt    Pancreatic cancer Maternal Aunt    Breast cancer Niece     Social History Social History   Tobacco Use   Smoking status: Every Day    Current packs/day: 0.75    Average packs/day: 0.8 packs/day for 50.7 years (38.0 ttl pk-yrs)    Types: Cigarettes    Start date: 1975   Smokeless tobacco: Never  Vaping Use   Vaping status: Never Used  Substance Use Topics   Alcohol use: Yes    Alcohol/week: 3.0 standard drinks of alcohol    Types: 3 Glasses of wine per week    Comment: occ   Drug use: Never     Allergies   Ace inhibitors and Hydrochlorothiazide   Review of Systems Review of Systems: negative unless otherwise stated in HPI.      Physical Exam Triage Vital Signs ED Triage Vitals  Encounter Vitals Group     BP 04/09/24 1043 (!) 182/110     Barbara Cortez Systolic BP Percentile --      Barbara Cortez Diastolic BP Percentile --      Boys Systolic BP Percentile --      Boys Diastolic BP Percentile --      Pulse Rate 04/09/24 1043 86     Resp 04/09/24 1043 15     Temp 04/09/24 1043 98.4 F (36.9 C)     Temp Source 04/09/24 1043 Oral     SpO2 04/09/24 1043 94 %     Weight 04/09/24 1040 238 lb 1.6 oz (108 kg)     Height 04/09/24 1040 5' 6 (1.676 m)     Head Circumference --      Peak Flow --      Pain Score 04/09/24 1040 7     Pain Loc --      Pain Education --      Exclude from Growth Chart --    No data found.  Updated Vital Signs BP (!) 175/107 (BP Location: Right Arm)   Pulse 86   Temp 98.4 F (36.9 C) (Oral)   Resp 15   Ht 5' 6 (1.676 m)   Wt 108 kg   SpO2 94%   BMI 38.43 kg/m   Visual Acuity Right Eye Distance:   Left Eye Distance:   Bilateral Distance:    Right Eye Near:   Left Eye Near:    Bilateral Near:     Physical Exam GEN:     alert, non-toxic  appearing female in no  distress    HENT:  mucus membranes moist, clear nasal discharge EYES:   pupils equal and reactive, no scleral injection or discharge RESP:  no increased work of breathing, rhonchi bilaterally, no wheezing or rales  CVS:   regular rate and rhythm Skin:   warm and dry    UC Treatments / Results  Labs (all labs ordered are listed, but only abnormal results are displayed) Labs Reviewed - No data to display  EKG   Radiology No results found.   Procedures Procedures (including critical care time)  Medications Ordered in UC Medications - No data to display  Initial Impression / Assessment and Plan / UC Course  I have reviewed the triage vital signs and the nursing notes.  Pertinent labs & imaging results that were available during my care of the patient were reviewed by me and considered in my medical decision making (see chart for details).       Pt is a 68 y.o. female who has COPD presents for 2-3 weeks of cough that is not improving.  Sira is  afebrile here without recent antipyretics. Satting adequately on room air. Overall pt is  non-toxic appearing, well hydrated, without respiratory distress. Pulmonary exam is remarkable for rhonchi that clear with cough.  After shared decision making, we will not pursue chest x-ray at this time.  COVID  and influenza testing deferred due to length of symptoms. Treat acute COPD exacerbation with steroids and antibiotics as below.  Typical duration of symptoms discussed.   Patient with acute cervical strain after sleeping wrong. Discussed with patient gradually returning to normal activities, as tolerated. Pt to continue ordinary activities within the limits permitted by pain. Will prescribe muscle relaxer  for tension and pain relief. Patient to return if symptoms do not improve with conservative treatment. Consider imaging at that time.   Cataleia is hypertensive here.  BP 182/110 then after sitting was 175/107. BP on  02/03/24 was 134/88. She Is prescribed amlodipine  5 mg, irbesartan  300 mg, metoprolol  200 mg daily, and lasix  40 mg daily.    Denies needing refills. Recommended she check herblood pressure and follow up with her primary care provider in the next 2 weeks.  Discontinue use of Afrin. Risks of Afrin use discussed.     Return and ED precautions given and patient voiced understanding. Discussed MDM, treatment plan and plan for follow-up with patient who agrees with plan.      Final Clinical Impressions(s) / UC Diagnoses   Final diagnoses:  Acute cervical myofascial strain, initial encounter  COPD exacerbation (HCC)  Elevated blood pressure reading with diagnosis of hypertension   Discharge Instructions   None    ED Prescriptions     Medication Sig Dispense Auth. Provider   methocarbamol  (ROBAXIN ) 500 MG tablet Take 1 tablet (500 mg total) by mouth 2 (two) times daily. 20 tablet Thad Osoria, DO   predniSONE  (DELTASONE ) 50 MG tablet Take 1 tablet (50 mg total) by mouth daily for 5 days. 5 tablet Raveen Wieseler, DO   azithromycin  (ZITHROMAX  Z-PAK) 250 MG tablet Take 2 tablets on day 1 then 1 tablet daily 6 tablet Ezel Vallone, DO      PDMP not reviewed this encounter.   Zion Lint, DO 04/09/24 1307

## 2024-04-09 NOTE — ED Triage Notes (Signed)
 Patient c/o sinus congestion, nasal congestion and cough that started 3 weeks ago.  Patient also reports neck tightness and pain for the past 3 days.  Patient denies fevers.  Patient has been taking OTC cold medicine with no relief.

## 2024-04-14 NOTE — Progress Notes (Signed)
 Shared Decision Making Visit Lung Cancer Screening Program [phone])     Eligibility: Age 68 y.o. Pack Years Smoking History Calculation 37.5 (# packs/per year x # years smoked) Recent History of coughing up blood  no Unexplained weight loss? no ( >Than 15 pounds within the last 6 months ) Prior History Lung / other cancer no (Diagnosis within the last 5 years already requiring surveillance chest CT Scans). Smoking Status Current Smoker Former Smokers: Years since quit: n/a       Quit Date: n/a   Visit Components: Discussion included one or more decision making aids. yes Discussion included risk/benefits of screening. yes Discussion included potential follow up diagnostic testing for abnormal scans. yes Discussion included meaning and risk of over diagnosis. yes Discussion included meaning and risk of False Positives. yes Discussion included meaning of total radiation exposure. yes   Counseling Included: Importance of adherence to annual lung cancer LDCT screening. yes Impact of comorbidities on ability to participate in the program. yes Ability and willingness to under diagnostic treatment. yes   Smoking Cessation Counseling: Current Smokers:  Discussed importance of smoking cessation. yes Information about tobacco cessation classes and interventions provided to patient. yes Patient provided with ticket for LDCT Scan. no Symptomatic Patient. no             Counseling(Intermediate counseling: > three minutes)  99406 Diagnosis Code: Tobacco Use Z72.0 Asymptomatic Patient yes        Counseling: Counseled patient 4 minutes regarding tobacco use.    Former Smokers:  Discussed the importance of maintaining cigarette abstinence. yes Diagnosis Code: Personal History of Nicotine Dependence. S12.108 Information about tobacco cessation classes and interventions provided to patient. Yes Patient provided with ticket for LDCT Scan. no Written Order for Lung Cancer Screening with  LDCT placed in Epic. Yes (CT Chest Lung Cancer Screening Low Dose W/O CM) [id number] Z12.2-Screening of respiratory organs Z87.891-Personal history of nicotine dependence     Laneta Speaks, RN

## 2024-04-14 NOTE — Progress Notes (Signed)
 Virtual Visit via Telephone Note   I connected with Barbara Cortez on 02/22/24 at 12:00 PM EDT by telephone and verified that I am speaking with the correct person using two identifiers.   Location: Patient: at home Provider: [address] [place], [place], [address]    I discussed the limitations, risks, security and privacy concerns of performing an evaluation and management service by telephone and the availability of in person appointments. I also discussed with the patient that there may be a patient responsible charge related to this service. The patient expressed understanding and agreed to proceed.

## 2024-05-03 ENCOUNTER — Encounter: Payer: Self-pay | Admitting: Student

## 2024-05-03 ENCOUNTER — Ambulatory Visit: Admitting: Student

## 2024-05-05 ENCOUNTER — Ambulatory Visit: Admitting: Student

## 2024-05-09 ENCOUNTER — Ambulatory Visit (INDEPENDENT_AMBULATORY_CARE_PROVIDER_SITE_OTHER): Admitting: Student

## 2024-05-09 ENCOUNTER — Encounter: Payer: Self-pay | Admitting: Student

## 2024-05-09 VITALS — BP 146/90 | HR 82 | Ht 66.0 in | Wt 238.0 lb

## 2024-05-09 DIAGNOSIS — J449 Chronic obstructive pulmonary disease, unspecified: Secondary | ICD-10-CM | POA: Insufficient documentation

## 2024-05-09 DIAGNOSIS — Z720 Tobacco use: Secondary | ICD-10-CM

## 2024-05-09 DIAGNOSIS — Z7984 Long term (current) use of oral hypoglycemic drugs: Secondary | ICD-10-CM

## 2024-05-09 DIAGNOSIS — I152 Hypertension secondary to endocrine disorders: Secondary | ICD-10-CM

## 2024-05-09 DIAGNOSIS — E1159 Type 2 diabetes mellitus with other circulatory complications: Secondary | ICD-10-CM | POA: Diagnosis not present

## 2024-05-09 DIAGNOSIS — Z23 Encounter for immunization: Secondary | ICD-10-CM | POA: Diagnosis not present

## 2024-05-09 DIAGNOSIS — E119 Type 2 diabetes mellitus without complications: Secondary | ICD-10-CM | POA: Diagnosis not present

## 2024-05-09 DIAGNOSIS — E785 Hyperlipidemia, unspecified: Secondary | ICD-10-CM

## 2024-05-09 LAB — POCT GLYCOSYLATED HEMOGLOBIN (HGB A1C): Hemoglobin A1C: 7.2 % — AB (ref 4.0–5.6)

## 2024-05-09 MED ORDER — OZEMPIC (0.25 OR 0.5 MG/DOSE) 2 MG/3ML ~~LOC~~ SOPN: 0.2500 mg | PEN_INJECTOR | SUBCUTANEOUS | 1 refills | Status: DC

## 2024-05-09 MED ORDER — ROSUVASTATIN CALCIUM 20 MG PO TABS: 20.0000 mg | ORAL_TABLET | Freq: Every day | ORAL | 3 refills | Status: DC

## 2024-05-09 MED ORDER — METOPROLOL SUCCINATE ER 200 MG PO TB24: 200.0000 mg | ORAL_TABLET | Freq: Every day | ORAL | 1 refills | Status: AC

## 2024-05-09 MED ORDER — AMLODIPINE BESYLATE 5 MG PO TABS: 5.0000 mg | ORAL_TABLET | Freq: Every day | ORAL | 1 refills | Status: AC

## 2024-05-09 MED ORDER — SPIRONOLACTONE 25 MG PO TABS: 25.0000 mg | ORAL_TABLET | Freq: Every day | ORAL | 3 refills | Status: AC

## 2024-05-09 MED ORDER — ATORVASTATIN CALCIUM 40 MG PO TABS: 40.0000 mg | ORAL_TABLET | Freq: Every day | ORAL | 3 refills | Status: DC

## 2024-05-09 NOTE — Assessment & Plan Note (Addendum)
 BP remains elevated today, is taking medications. Has been working on weight with exercise. Treadmill 2-3 times a week about 30 minutes, used to do water aerobics and weight exercises at gym but has gone since January. Unable to tolerate thiazide in the past due to rash. Start spironolactone 25 mg daily, continue metoprolol , irbesartan , and lasix .

## 2024-05-09 NOTE — Assessment & Plan Note (Addendum)
 Is currently on metformin  500 mg daily, previously on 1000 mg daily, and ozempic  but stopped with improved A1c. Reports she was on lowest dose of ozempic , does not recall adverse effects on this. Did not lose weight on this. Does have loose stools in the morning on metformin . Also would like to lose weight. A1c rising to 7.2% today from 6.9%. Has not check fingersticks at home lately. -continue metformin  XR 500 mg daily -start ozempic  0.25 mg weekly, if tolerating can titrate to 0.5 mg weekly after 0.5 mg weekly.

## 2024-05-09 NOTE — Assessment & Plan Note (Signed)
 Reports rosuvastatin  20mg  was $99 for 90 day supply will try switching to atorvastatin 40 mg daily to see if this is more affordable.

## 2024-05-09 NOTE — Assessment & Plan Note (Addendum)
 Has not had a cigarette in 2 days. Encouraged to continue with cessation.

## 2024-05-09 NOTE — Progress Notes (Signed)
 Established Patient Office Visit  Subjective   Patient ID: Barbara Cortez, female    DOB: Feb 18, 1956  Age: 68 y.o. MRN: [id number]  Chief Complaint  Patient presents with   Diabetes   Hypertension    Barbara Cortez with medical hx listed below presents today for follow up of hypertension and diabetes. Please refer to problem based charting for further details and assessment and plan of current problem and chronic medical conditions.  Patient Active Problem List   Diagnosis Date Noted   COPD (chronic obstructive pulmonary disease) (HCC) 05/09/2024   Obesity 02/03/2024   Tobacco use 02/03/2024   Osteopenia of femoral neck 02/03/2024   Lymphedema of both lower extremities 10/19/2018   Lumbosacral spondylosis with radiculopathy 07/14/2018   Tubular adenoma of colon 01/29/2015   Diabetes mellitus type II, controlled (HCC) 06/05/2014   HLD (hyperlipidemia) 02/05/2014   Hypertension associated with type 2 diabetes mellitus (HCC) 02/05/2014      ROS Refer to HPI    Objective:     Outpatient Encounter Medications as of 05/09/2024  Medication Sig   amLODipine  (NORVASC ) 5 MG tablet Take 1 tablet (5 mg total) by mouth daily.   ascorbic acid (VITAMIN C) 500 MG tablet Take 500 mg by mouth daily.   Calcium  Carb-Cholecalciferol (CALCIUM  CARBONATE-VITAMIN D3) 600-400 MG-UNIT TABS Take 1 tablet by mouth daily.   furosemide  (LASIX ) 40 MG tablet Take 1 tablet (40 mg total) by mouth daily.   irbesartan  (AVAPRO ) 300 MG tablet Take 1 tablet (300 mg total) by mouth daily.   metFORMIN  (GLUCOPHAGE -XR) 500 MG 24 hr tablet Take 1 tablet (500 mg total) by mouth daily.   metoprolol  (TOPROL -XL) 200 MG 24 hr tablet Take 1 tablet (200 mg total) by mouth daily.   Omega-3 Fatty Acids (FISH OIL) 1000 MG CAPS Take 2 capsules by mouth daily.   rosuvastatin  (CRESTOR ) 20 MG tablet Take 1 tablet (20 mg total) by mouth daily.   Semaglutide ,0.25 or 0.5MG /DOS, (OZEMPIC , 0.25 OR 0.5 MG/DOSE,) 2 MG/3ML SOPN Inject  0.25 mg into the skin once a week.   spironolactone (ALDACTONE) 25 MG tablet Take 1 tablet (25 mg total) by mouth daily.   [DISCONTINUED] methocarbamol  (ROBAXIN ) 500 MG tablet Take 1 tablet (500 mg total) by mouth 2 (two) times daily.   [DISCONTINUED] azithromycin  (ZITHROMAX  Z-PAK) 250 MG tablet Take 2 tablets on day 1 then 1 tablet daily (Patient not taking: Reported on 05/09/2024)   No facility-administered encounter medications on file as of 05/09/2024.    BP (!) 146/90   Pulse 82   Ht 5' 6 (1.676 m)   Wt 238 lb (108 kg)   SpO2 96%   BMI 38.41 kg/m  BP Readings from Last 3 Encounters:  05/09/24 (!) 146/90  04/09/24 (!) 175/107  02/03/24 134/88    Physical Exam Constitutional:      Appearance: Normal appearance.  HENT:     Mouth/Throat:     Mouth: Mucous membranes are moist.     Pharynx: Oropharynx is clear.  Cardiovascular:     Rate and Rhythm: Normal rate and regular rhythm.  Pulmonary:     Effort: Pulmonary effort is normal.     Breath sounds: No rhonchi or rales.  Abdominal:     General: Abdomen is flat. Bowel sounds are normal. There is no distension.     Palpations: Abdomen is soft.     Tenderness: There is no abdominal tenderness.  Musculoskeletal:        General: Normal range of  motion.     Right lower leg: No edema.     Left lower leg: No edema.  Skin:    General: Skin is warm and dry.     Capillary Refill: Capillary refill takes less than 2 seconds.  Neurological:     General: No focal deficit present.     Mental Status: She is alert and oriented to person, place, and time.  Psychiatric:        Mood and Affect: Mood normal.        Behavior: Behavior normal.        05/09/2024    9:47 AM 02/03/2024    8:15 AM 10/04/2023    8:12 AM  Depression screen PHQ 2/9  Decreased Interest 0 0 0  Down, Depressed, Hopeless 0 0 0  PHQ - 2 Score 0 0 0  Altered sleeping   0  Tired, decreased energy   0  Change in appetite   0  Feeling bad or failure about yourself     0  Trouble concentrating   0  Moving slowly or fidgety/restless   0  Suicidal thoughts   0  PHQ-9 Score   0  Difficult doing work/chores   Not difficult at all       05/09/2024    9:47 AM 02/03/2024    8:16 AM 10/04/2023    8:13 AM 06/08/2023    8:41 AM  GAD 7 : Generalized Anxiety Score  Nervous, Anxious, on Edge 0 0 0 0  Control/stop worrying 0 0 0 0  Worry too much - different things  0 0 0  Trouble relaxing  0 0 0  Restless  0 0 0  Easily annoyed or irritable  0 0 0  Afraid - awful might happen  0 0 0  Total GAD 7 Score  0 0 0  Anxiety Difficulty  Not difficult at all Not difficult at all Not difficult at all    Results for orders placed or performed in visit on 05/09/24  POCT glycosylated hemoglobin (Hb A1C)  Result Value Ref Range   Hemoglobin A1C 7.2 (A) 4.0 - 5.6 %   Last metabolic panel Lab Results  Component Value Date   GLUCOSE 127 (H) 10/04/2023   NA 143 10/04/2023   K 5.1 10/04/2023   CL 102 10/04/2023   CO2 26 10/04/2023   BUN 15 10/04/2023   CREATININE 0.70 10/04/2023   EGFR 95 10/04/2023   CALCIUM  10.5 (H) 10/04/2023   PHOS 3.8 01/25/2023   PROT 6.6 10/04/2023   ALBUMIN 4.4 10/04/2023   LABGLOB 2.2 10/04/2023   AGRATIO 1.9 07/24/2022   BILITOT 1.3 (H) 10/04/2023   ALKPHOS 110 10/04/2023   AST 21 10/04/2023   ALT 25 10/04/2023   ANIONGAP 10 01/25/2023   Last lipids Lab Results  Component Value Date   CHOL 208 (H) 10/04/2023   HDL 57 10/04/2023   LDLCALC 122 (H) 10/04/2023   TRIG 165 (H) 10/04/2023   Last hemoglobin A1c Lab Results  Component Value Date   HGBA1C 7.2 (A) 05/09/2024      The 10-year ASCVD risk score (Arnett DK, et al., 2019) is: 32.9%    Assessment & Plan:  Diabetes mellitus type II, controlled (HCC) Assessment & Plan: Is currently on metformin  500 mg daily, previously on 1000 mg daily, and ozempic  but stopped with improved A1c   Orders: -     POCT glycosylated hemoglobin (Hb A1C)  Encounter for immunization -  Flu vaccine HIGH DOSE PF(Fluzone Trivalent)  Hypertension associated with type 2 diabetes mellitus (HCC) Assessment & Plan: BP remains elevated today, is taking medications. Has been working on weight with exercise. Treadmill 2-3 times a week about 30 minutes, used to do water aerobics and weight exercises, try to watch what she eattrying to eat bette, does not eat fast food,  Spironolactone    Tobacco use Assessment & Plan: Has not had a cigarette in 2 days. Working on stained glass   Other orders -     Spironolactone; Take 1 tablet (25 mg total) by mouth daily.  Dispense: 90 tablet; Refill: 3 -     Ozempic  (0.25 or 0.5 MG/DOSE); Inject 0.25 mg into the skin once a week.  Dispense: 3 mL; Refill: 1     Return in about 4 weeks (around 06/06/2024) for HTN.    Harlene Saddler, MD

## 2024-05-12 ENCOUNTER — Telehealth: Payer: Self-pay

## 2024-05-15 ENCOUNTER — Other Ambulatory Visit (HOSPITAL_COMMUNITY): Payer: Self-pay

## 2024-05-15 ENCOUNTER — Telehealth: Payer: Self-pay | Admitting: Pharmacy Technician

## 2024-05-15 NOTE — Telephone Encounter (Signed)
 Please review. Looks like they called regarding the PA

## 2024-05-15 NOTE — Telephone Encounter (Signed)
 Mliss from [facility] called to advise that the PA for Ozempic  was approved and start date is 10/13 start. She said for any additional questions they can be reached at [phone] option 5. Mliss will also fax the approval to the practice

## 2024-05-15 NOTE — Telephone Encounter (Signed)
 Pharmacy Patient Advocate Encounter  Received notification from [facility] that Prior Authorization for Ozempic  (0.25 or 0.5 MG/DOSE) 2MG /3ML pen-injectors has been APPROVED from 05/15/24 to 05/15/25. Ran test claim, Copay is $45.00. This test claim was processed through [facility]- copay amounts may vary at other pharmacies due to pharmacy/plan contracts, or as the patient moves through the different stages of their insurance plan.   PA #/Case ID/Reference #: [id number]

## 2024-05-15 NOTE — Telephone Encounter (Signed)
 Pharmacy Patient Advocate Encounter   Received notification from Pt Calls Messages that prior authorization for Ozempic  (0.25 or 0.5 MG/DOSE) 2MG /3ML pen-injectors is required/requested.   Insurance verification completed.   The patient is insured through [facility].   Per test claim: PA required; PA submitted to above mentioned insurance via Latent Key/confirmation #/EOC [id number] Status is pending

## 2024-05-15 NOTE — Telephone Encounter (Signed)
 PA request has been Submitted. New Encounter has been or will be created for follow up. For additional info see Pharmacy Prior Auth telephone encounter from 05/15/24.

## 2024-05-29 ENCOUNTER — Encounter: Payer: Self-pay | Admitting: Student

## 2024-05-29 ENCOUNTER — Ambulatory Visit: Payer: Self-pay | Admitting: *Deleted

## 2024-05-29 ENCOUNTER — Ambulatory Visit (INDEPENDENT_AMBULATORY_CARE_PROVIDER_SITE_OTHER): Admitting: Student

## 2024-05-29 VITALS — BP 142/98 | HR 82 | Ht 66.0 in | Wt 237.0 lb

## 2024-05-29 DIAGNOSIS — Z7984 Long term (current) use of oral hypoglycemic drugs: Secondary | ICD-10-CM | POA: Diagnosis not present

## 2024-05-29 DIAGNOSIS — M542 Cervicalgia: Secondary | ICD-10-CM | POA: Diagnosis not present

## 2024-05-29 DIAGNOSIS — I152 Hypertension secondary to endocrine disorders: Secondary | ICD-10-CM | POA: Diagnosis not present

## 2024-05-29 DIAGNOSIS — E1159 Type 2 diabetes mellitus with other circulatory complications: Secondary | ICD-10-CM

## 2024-05-29 DIAGNOSIS — Z7985 Long-term (current) use of injectable non-insulin antidiabetic drugs: Secondary | ICD-10-CM

## 2024-05-29 MED ORDER — MELOXICAM 7.5 MG PO TABS: 7.5000 mg | ORAL_TABLET | Freq: Every day | ORAL | 0 refills | Status: AC

## 2024-05-29 NOTE — Telephone Encounter (Signed)
 Noted  Pt has appt.  KP

## 2024-05-29 NOTE — Progress Notes (Unsigned)
 Established Patient Office Visit  Subjective   Patient ID: Barbara Cortez, female    DOB: 1956-01-25  Age: 68 y.o. MRN: [id number]  Chief Complaint  Patient presents with   Headache    Back of the head radiates to the sides of her shoulders   Neck Pain    Stiff neck x 2 weeks    [name] with medical hx listed below presents today for neck stiffness that started 2 weeks ago. Saws UC on 9/7 for similar symptoms. Started 10/15. Tried heat pack, cold packs, and massage without much improvement. Tied some left over muscle relaxer without improvement Robaxin . Has been alternating tylenol and ibuprofen with mild improvement.   Headache constant dull discomfort. Denies trauma. Has not had a cigarette since this all started.  No fevers, chills     Patient Active Problem List   Diagnosis Date Noted   COPD (chronic obstructive pulmonary disease) (HCC) 05/09/2024   Obesity 02/03/2024   Tobacco use 02/03/2024   Osteopenia of femoral neck 02/03/2024   Lymphedema of both lower extremities 10/19/2018   Lumbosacral spondylosis with radiculopathy 07/14/2018   Tubular adenoma of colon 01/29/2015   Diabetes mellitus type II, controlled (HCC) 06/05/2014   HLD (hyperlipidemia) 02/05/2014   Hypertension associated with type 2 diabetes mellitus (HCC) 02/05/2014      ROS Refer to HPI    Objective:     Outpatient Encounter Medications as of 05/29/2024  Medication Sig   amLODipine  (NORVASC ) 5 MG tablet Take 1 tablet (5 mg total) by mouth daily.   ascorbic acid (VITAMIN C) 500 MG tablet Take 500 mg by mouth daily.   Calcium  Carb-Cholecalciferol (CALCIUM  CARBONATE-VITAMIN D3) 600-400 MG-UNIT TABS Take 1 tablet by mouth daily.   furosemide  (LASIX ) 40 MG tablet Take 1 tablet (40 mg total) by mouth daily.   irbesartan  (AVAPRO ) 300 MG tablet Take 1 tablet (300 mg total) by mouth daily.   metFORMIN  (GLUCOPHAGE -XR) 500 MG 24 hr tablet Take 1 tablet (500 mg total) by mouth daily.   metoprolol   (TOPROL -XL) 200 MG 24 hr tablet Take 1 tablet (200 mg total) by mouth daily.   Omega-3 Fatty Acids (FISH OIL) 1000 MG CAPS Take 2 capsules by mouth daily.   rosuvastatin  (CRESTOR ) 20 MG tablet Take 20 mg by mouth at bedtime.   Semaglutide ,0.25 or 0.5MG /DOS, (OZEMPIC , 0.25 OR 0.5 MG/DOSE,) 2 MG/3ML SOPN Inject 0.25 mg into the skin once a week.   spironolactone (ALDACTONE) 25 MG tablet Take 1 tablet (25 mg total) by mouth daily.   [DISCONTINUED] atorvastatin (LIPITOR) 40 MG tablet Take 1 tablet (40 mg total) by mouth daily.   No facility-administered encounter medications on file as of 05/29/2024.    BP (!) 142/98   Pulse 82   Ht 5' 6 (1.676 m)   Wt 237 lb (107.5 kg)   PF 94 L/min   BMI 38.25 kg/m  BP Readings from Last 3 Encounters:  05/29/24 (!) 142/98  05/09/24 (!) 146/90  04/09/24 (!) 175/107    Physical Exam     05/09/2024    9:47 AM 02/03/2024    8:15 AM 10/04/2023    8:12 AM  Depression screen PHQ 2/9  Decreased Interest 0 0 0  Down, Depressed, Hopeless 0 0 0  PHQ - 2 Score 0 0 0  Altered sleeping   0  Tired, decreased energy   0  Change in appetite   0  Feeling bad or failure about yourself    0  Trouble concentrating   0  Moving slowly or fidgety/restless   0  Suicidal thoughts   0  PHQ-9 Score   0  Difficult doing work/chores   Not difficult at all       05/09/2024    9:47 AM 02/03/2024    8:16 AM 10/04/2023    8:13 AM 06/08/2023    8:41 AM  GAD 7 : Generalized Anxiety Score  Nervous, Anxious, on Edge 0 0 0 0  Control/stop worrying 0 0 0 0  Worry too much - different things  0 0 0  Trouble relaxing  0 0 0  Restless  0 0 0  Easily annoyed or irritable  0 0 0  Afraid - awful might happen  0 0 0  Total GAD 7 Score  0 0 0  Anxiety Difficulty  Not difficult at all Not difficult at all Not difficult at all    No results found for any visits on 05/29/24.  {Labs (Optional):23779}  The 10-year ASCVD risk score (Arnett DK, et al., 2019) is: 31.5%     Assessment & Plan:  There are no diagnoses linked to this encounter.   No follow-ups on file.    Harlene Saddler, MD

## 2024-05-29 NOTE — Telephone Encounter (Signed)
 FYI Only or Action Required?: FYI only for provider.  Patient was last seen in primary care on 05/09/2024 by Barbara Raisin, MD.  Called Nurse Triage reporting Headache.  Symptoms began several weeks ago.  Interventions attempted: Prescription medications: norvasc  and Rest, hydration, or home remedies.  Symptoms are: unchanged.  Triage Disposition: See PCP Within 2 Weeks, See Physician Within 24 Hours Recommended to be seen today.  Patient/caregiver understands and will follow disposition?: Yes           Copied from CRM [id number]. Topic: Clinical - Red Word Triage >> May 29, 2024  9:19 AM Joesph NOVAK wrote: Red Word that prompted transfer to Nurse Triage: Headache and stiff neck, high BP. Since oct 16th. Reason for Disposition  [1] MODERATE headache (e.g., interferes with normal activities) AND [2] present > 24 hours AND [3] unexplained  (Exceptions: Pain medicines not tried, typical migraine, or headache part of viral illness.)  [1] Systolic BP >= 130 OR Diastolic >= 80 AND [2] taking BP medications  Answer Assessment - Initial Assessment Questions 1. LOCATION: Where does it hurt?      Base of head and radiates up to sides of head and neck stiffness 2. ONSET: When did the headache start? (e.g., minutes, hours, days)     05/18/24 3. PATTERN: Does the pain come and go, or has it been constant since it started?     Constant  4. SEVERITY: How bad is the pain? and What does it keep you from doing?  (e.g., Scale 1-10; mild, moderate, or severe)     Mild to moderate  5. RECURRENT SYMPTOM: Have you ever had headaches before? If Yes, ask: When was the last time? and What happened that time?      Na  6. CAUSE: What do you think is causing the headache?     Not sure possible headache  7. MIGRAINE: Have you been diagnosed with migraine headaches? If Yes, ask: Is this headache similar?      na 8. HEAD INJURY: Has there been any recent injury to your head?       na 9. OTHER SYMPTOMS: Do you have any other symptoms? (e.g., fever, stiff neck, eye pain, sore throat, cold symptoms)     Stiff neck , headaches dull achy. Elevated BP  10. PREGNANCY: Is there any chance you are pregnant? When was your last menstrual period?       na  Answer Assessment - Initial Assessment Questions Appt scheduled today .  Paitent unsure if montior accurate. Recommended to bring monitor to appt to compare BP readings. Recommended if sx worsen go to ED.   1. BLOOD PRESSURE: What is your blood pressure? Did you take at least two measurements 5 minutes apart?     BP 176/109 and rechecked BP 166/96 2. ONSET: When did you take your blood pressure?     Now  3. HOW: How did you take your blood pressure? (e.g., automatic home BP monitor, visiting nurse)     Automatic home BP monitor  4. HISTORY: Do you have a history of high blood pressure?     Yes  5. MEDICINES: Are you taking any medicines for blood pressure? Have you missed any doses recently?     Yes has not missed any doses 6. OTHER SYMPTOMS: Do you have any symptoms? (e.g., blurred vision, chest pain, difficulty breathing, headache, weakness)     No chest pain no difficulty breathing, headache dull mild now . Stiffness in neck. Can  put chin to chest. No weakness on either side of body no blurred vision. Hx DM 7. PREGNANCY: Is there any chance you are pregnant? When was your last menstrual period?     na  Protocols used: Headache-A-AH, Blood Pressure - High-A-AH

## 2024-05-30 ENCOUNTER — Ambulatory Visit: Payer: Self-pay | Admitting: Student

## 2024-05-30 LAB — BASIC METABOLIC PANEL WITH GFR
BUN/Creatinine Ratio: 21 (ref 12–28)
BUN: 17 mg/dL (ref 8–27)
CO2: 28 mmol/L (ref 20–29)
Calcium: 11.2 mg/dL — ABNORMAL HIGH (ref 8.7–10.3)
Chloride: 100 mmol/L (ref 96–106)
Creatinine, Ser: 0.81 mg/dL (ref 0.57–1.00)
Glucose: 118 mg/dL — ABNORMAL HIGH (ref 70–99)
Potassium: 4.4 mmol/L (ref 3.5–5.2)
Sodium: 145 mmol/L — ABNORMAL HIGH (ref 134–144)
eGFR: 80 mL/min/1.73 (ref >59)

## 2024-05-31 DIAGNOSIS — M542 Cervicalgia: Secondary | ICD-10-CM | POA: Insufficient documentation

## 2024-05-31 NOTE — Assessment & Plan Note (Signed)
 BP elevated likely due to neck pain. Started on spironolactone at last visit. BMP today. Reevaluated HTN once neck pain improves

## 2024-05-31 NOTE — Assessment & Plan Note (Signed)
 Likely cervical strain. Meloxicam  for pain. Can continue heat/col and neck massage. Follow up if pain is not improving.

## 2024-05-31 NOTE — Progress Notes (Signed)
 Spoke with patient and she verbalized understanding and will discuss her calcium  supplement with you at her upcoming appt on 11/06.  JM

## 2024-06-08 ENCOUNTER — Ambulatory Visit (INDEPENDENT_AMBULATORY_CARE_PROVIDER_SITE_OTHER): Admitting: Student

## 2024-06-08 ENCOUNTER — Encounter: Payer: Self-pay | Admitting: Student

## 2024-06-08 VITALS — BP 138/88 | HR 91 | Ht 66.0 in | Wt 237.0 lb

## 2024-06-08 DIAGNOSIS — I152 Hypertension secondary to endocrine disorders: Secondary | ICD-10-CM

## 2024-06-08 DIAGNOSIS — Z7985 Long-term (current) use of injectable non-insulin antidiabetic drugs: Secondary | ICD-10-CM

## 2024-06-08 DIAGNOSIS — E119 Type 2 diabetes mellitus without complications: Secondary | ICD-10-CM

## 2024-06-08 DIAGNOSIS — E1159 Type 2 diabetes mellitus with other circulatory complications: Secondary | ICD-10-CM | POA: Diagnosis not present

## 2024-06-08 DIAGNOSIS — E66812 Obesity, class 2: Secondary | ICD-10-CM | POA: Diagnosis not present

## 2024-06-08 DIAGNOSIS — Z6838 Body mass index (BMI) 38.0-38.9, adult: Secondary | ICD-10-CM

## 2024-06-08 MED ORDER — SEMAGLUTIDE (1 MG/DOSE) 4 MG/3ML ~~LOC~~ SOPN: 1.0000 mg | PEN_INJECTOR | SUBCUTANEOUS | 3 refills | Status: DC

## 2024-06-08 NOTE — Assessment & Plan Note (Addendum)
 BP improved today to 138/84, has not checked her BP lately. Continue current medication, working on weight loss and exercise. Will have her keep log and bring to next appointment.

## 2024-06-08 NOTE — Progress Notes (Signed)
 Established Patient Office Visit  Subjective   Patient ID: Barbara Cortez, female    DOB: July 24, 1956  Age: 68 y.o. MRN: [id number]  Chief Complaint  Patient presents with   Hypertension    [name] with medical hx listed below presents today for follow up of hypertension and obesity. Please refer to problem based charting for further details and assessment and plan of current problem and chronic medical conditions.  Patient Active Problem List   Diagnosis Date Noted   Neck pain 05/31/2024   COPD (chronic obstructive pulmonary disease) (HCC) 05/09/2024   Obesity 02/03/2024   Tobacco use 02/03/2024   Osteopenia of femoral neck 02/03/2024   Lymphedema of both lower extremities 10/19/2018   Lumbosacral spondylosis with radiculopathy 07/14/2018   Tubular adenoma of colon 01/29/2015   Diabetes mellitus type II, controlled (HCC) 06/05/2014   HLD (hyperlipidemia) 02/05/2014   Hypertension associated with type 2 diabetes mellitus (HCC) 02/05/2014      ROS Refer to HPI    Objective:     Outpatient Encounter Medications as of 06/08/2024  Medication Sig   amLODipine  (NORVASC ) 5 MG tablet Take 1 tablet (5 mg total) by mouth daily.   ascorbic acid (VITAMIN C) 500 MG tablet Take 500 mg by mouth daily.   Calcium  Carb-Cholecalciferol (CALCIUM  CARBONATE-VITAMIN D3) 600-400 MG-UNIT TABS Take 1 tablet by mouth daily.   furosemide  (LASIX ) 40 MG tablet Take 1 tablet (40 mg total) by mouth daily.   irbesartan  (AVAPRO ) 300 MG tablet Take 1 tablet (300 mg total) by mouth daily.   meloxicam  (MOBIC ) 7.5 MG tablet Take 1 tablet (7.5 mg total) by mouth daily.   metFORMIN  (GLUCOPHAGE -XR) 500 MG 24 hr tablet Take 1 tablet (500 mg total) by mouth daily.   metoprolol  (TOPROL -XL) 200 MG 24 hr tablet Take 1 tablet (200 mg total) by mouth daily.   Omega-3 Fatty Acids (FISH OIL) 1000 MG CAPS Take 2 capsules by mouth daily.   rosuvastatin  (CRESTOR ) 20 MG tablet Take 20 mg by mouth at bedtime.    Semaglutide , 1 MG/DOSE, 4 MG/3ML SOPN Inject 1 mg as directed once a week.   spironolactone (ALDACTONE) 25 MG tablet Take 1 tablet (25 mg total) by mouth daily.   [DISCONTINUED] Semaglutide ,0.25 or 0.5MG /DOS, (OZEMPIC , 0.25 OR 0.5 MG/DOSE,) 2 MG/3ML SOPN Inject 0.25 mg into the skin once a week.   No facility-administered encounter medications on file as of 06/08/2024.    BP 138/88   Pulse 91   Ht 5' 6 (1.676 m)   Wt 237 lb (107.5 kg)   SpO2 95%   BMI 38.25 kg/m  BP Readings from Last 3 Encounters:  06/08/24 138/88  05/29/24 (!) 142/98  05/09/24 (!) 146/90    Physical Exam Constitutional:      Appearance: Normal appearance.  HENT:     Head: Normocephalic and atraumatic.  Eyes:     Extraocular Movements: Extraocular movements intact.     Pupils: Pupils are equal, round, and reactive to light.  Cardiovascular:     Rate and Rhythm: Normal rate and regular rhythm.     Heart sounds: No murmur heard. Pulmonary:     Effort: Pulmonary effort is normal.     Breath sounds: No rhonchi or rales.  Abdominal:     General: Abdomen is flat. Bowel sounds are normal. There is no distension.     Palpations: Abdomen is soft.     Tenderness: There is no abdominal tenderness.  Musculoskeletal:  General: Normal range of motion.     Cervical back: Normal range of motion. No rigidity.     Right lower leg: No edema.     Left lower leg: No edema.  Skin:    General: Skin is warm and dry.     Capillary Refill: Capillary refill takes less than 2 seconds.  Neurological:     General: No focal deficit present.     Mental Status: She is alert and oriented to person, place, and time.  Psychiatric:        Mood and Affect: Mood normal.        Behavior: Behavior normal.        06/08/2024    9:17 AM 05/09/2024    9:47 AM 02/03/2024    8:15 AM  Depression screen PHQ 2/9  Decreased Interest 0 0 0  Down, Depressed, Hopeless 0 0 0  PHQ - 2 Score 0 0 0       06/08/2024    9:17 AM 05/09/2024     9:47 AM 02/03/2024    8:16 AM 10/04/2023    8:13 AM  GAD 7 : Generalized Anxiety Score  Nervous, Anxious, on Edge 0 0 0 0  Control/stop worrying 0 0 0 0  Worry too much - different things   0 0  Trouble relaxing   0 0  Restless   0 0  Easily annoyed or irritable   0 0  Afraid - awful might happen   0 0  Total GAD 7 Score   0 0  Anxiety Difficulty   Not difficult at all Not difficult at all    No results found for any visits on 06/08/24.  Last metabolic panel Lab Results  Component Value Date   GLUCOSE 118 (H) 05/29/2024   NA 145 (H) 05/29/2024   K 4.4 05/29/2024   CL 100 05/29/2024   CO2 28 05/29/2024   BUN 17 05/29/2024   CREATININE 0.81 05/29/2024   EGFR 80 05/29/2024   CALCIUM  11.2 (H) 05/29/2024   PHOS 3.8 01/25/2023   PROT 6.6 10/04/2023   ALBUMIN 4.4 10/04/2023   LABGLOB 2.2 10/04/2023   AGRATIO 1.9 07/24/2022   BILITOT 1.3 (H) 10/04/2023   ALKPHOS 110 10/04/2023   AST 21 10/04/2023   ALT 25 10/04/2023   ANIONGAP 10 01/25/2023   Last lipids Lab Results  Component Value Date   CHOL 208 (H) 10/04/2023   HDL 57 10/04/2023   LDLCALC 122 (H) 10/04/2023   TRIG 165 (H) 10/04/2023   Last hemoglobin A1c Lab Results  Component Value Date   HGBA1C 7.2 (A) 05/09/2024      The 10-year ASCVD risk score (Arnett DK, et al., 2019) is: 30%    Assessment & Plan:  Hypertension associated with type 2 diabetes mellitus (HCC) Assessment & Plan: BP improved today to 138/84, has not checked her BP lately. Continue current medication, working on weight loss and exercise. Will have her keep log and bring to next appointment.    Diabetes mellitus type II, controlled (HCC) Assessment & Plan: She is tolerating ozempic  0.5 mg weekly has had 2 doses of this and has 1 dose left. No weight loss noted on this. Increase ozempic  to 1 mg weekly after she completes last dose of ozempic . Referral made to RD as she expresses difficulty cooking for a single person, likes sweets and baking.  Follow up diabetes in 3 months.   Orders: -     Referral to Nutrition and  Diabetes Services  Class 2 severe obesity due to excess calories with serious comorbidity and body mass index (BMI) of 38.0 to 38.9 in adult Assessment & Plan: Increasing ozempic  weekly, discussed reducing sweets and portion size, she is walking on treadmill 2-3 times a week for 30 minutes at a time. Recommended increasing to about weekly. RD referral for nutrition counseling.    Other orders -     Semaglutide  (1 MG/DOSE); Inject 1 mg as directed once a week.  Dispense: 3 mL; Refill: 3     Return in about 3 months (around 09/08/2024) for DM.    Harlene Saddler, MD

## 2024-06-08 NOTE — Assessment & Plan Note (Signed)
 Increasing ozempic  weekly, discussed reducing sweets and portion size, she is walking on treadmill 2-3 times a week for 30 minutes at a time. Recommended increasing to about weekly. RD referral for nutrition counseling.

## 2024-06-08 NOTE — Assessment & Plan Note (Addendum)
 She is tolerating ozempic  0.5 mg weekly has had 2 doses of this and has 1 dose left. No weight loss noted on this. Increase ozempic  to 1 mg weekly after she completes last dose of ozempic . Referral made to RD as she expresses difficulty cooking for a single person, likes sweets and baking. Follow up diabetes in 3 months.

## 2024-06-16 NOTE — Progress Notes (Signed)
 Diabetes Self-Management Education  Visit Type: First/Initial  Appt. Start Time: 0800 Appt. End Time: 0905  06/19/2024  [name], identified by name and date of birth, is a 68 y.o. female with a diagnosis of Diabetes: Type 2.   ASSESSMENT Patient is here today alone. Patient would like to reduce body weight to a lower BMI range. Patient lives alone and find eating for one challenging. Pt does her own shopping and cooking. Pt reports eating out once times monthly. Pt reports she is lactose intolerant and selects lactaid and can tolerate cheese and yogurt.  Pt reports making the following changes including using her treadmill at home and tracks daily steps stating an average 3-4 K.  Pt reports she has tried weight watchers and restrictive eating times in the past. Pt reports she recently retired. 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 All Pt's questions were answered during this encounter.    History includes:   Past Medical History:  Diagnosis Date   Anxiety    Broken arm, left, closed, initial encounter    Chronic insomnia    COPD (chronic obstructive pulmonary disease) (HCC)    Diabetes mellitus without complication (HCC)    Diverticulosis    Hypertension    Impaired glucose tolerance    Internal hemorrhoids     Medications include:   Current Outpatient Medications:    amLODipine  (NORVASC ) 5 MG tablet, Take 1 tablet (5 mg total) by mouth daily., Disp: 90 tablet, Rfl: 1   ascorbic acid (VITAMIN C) 500 MG tablet, Take 500 mg by mouth daily., Disp: , Rfl:    Calcium  Carb-Cholecalciferol (CALCIUM  CARBONATE-VITAMIN D3) 600-400 MG-UNIT TABS, Take 1 tablet by mouth daily., Disp: , Rfl:    furosemide  (LASIX ) 40 MG tablet, Take 1 tablet (40 mg total) by mouth daily., Disp: 90 tablet, Rfl: 1   irbesartan  (AVAPRO ) 300 MG tablet, Take 1 tablet (300 mg total) by mouth daily., Disp: 90 tablet, Rfl: 1   meloxicam  (MOBIC ) 7.5 MG tablet, Take 1  tablet (7.5 mg total) by mouth daily., Disp: 30 tablet, Rfl: 0   metFORMIN  (GLUCOPHAGE -XR) 500 MG 24 hr tablet, Take 1 tablet (500 mg total) by mouth daily., Disp: 90 tablet, Rfl: 1   metoprolol  (TOPROL -XL) 200 MG 24 hr tablet, Take 1 tablet (200 mg total) by mouth daily., Disp: 90 tablet, Rfl: 1   Omega-3 Fatty Acids (FISH OIL) 1000 MG CAPS, Take 2 capsules by mouth daily., Disp: , Rfl:    rosuvastatin  (CRESTOR ) 20 MG tablet, Take 20 mg by mouth at bedtime., Disp: , Rfl:    Semaglutide , 1 MG/DOSE, 4 MG/3ML SOPN, Inject 1 mg as directed once a week., Disp: 3 mL, Rfl: 3   spironolactone (ALDACTONE) 25 MG tablet, Take 1 tablet (25 mg total) by mouth daily., Disp: 90 tablet, Rfl: 3  Rosuvastin 20 mg,Metoprolol  succinate ER 200mg , Spironolactone 25 mg, Amlodipine  Besylate 5 mg, Irbesartan  300 mg,Furosemide   40 mg, Metformin  500 mg. All meds once daily. Ozempic  weekly Labs noted:   Lab Results  Component Value Date   HGBA1C 7.2 (A) 05/09/2024   Lab Results  Component Value Date   CHOL 208 (H) 10/04/2023   HDL 57 10/04/2023   LDLCALC 122 (H) 10/04/2023   TRIG 165 (H) 10/04/2023   Wt Readings from Last 3 Encounters:  06/08/24 237 lb (107.5 kg)  05/29/24 237 lb (107.5 kg)  05/09/24 238 lb (108 kg)   There were no  vitals taken for this visit. There is no height or weight on file to calculate BMI.   Diabetes Self-Management Education - 06/19/24 0755       Visit Information   Visit Type First/Initial      Initial Visit   Diabetes Type Type 2    Date Diagnosed 2022    Are you currently following a meal plan? No    Are you taking your medications as prescribed? Yes      Health Coping   How would you rate your overall health? Excellent      Psychosocial Assessment   Patient Belief/Attitude about Diabetes Motivated to manage diabetes    What is the hardest part about your diabetes right now, causing you the most concern, or is the most worrisome to you about your diabetes?   Making  healty food and beverage choices;Being active    Self-care barriers None    Self-management support Doctor's office    Other persons present Patient    Patient Concerns Nutrition/Meal planning;Weight Control;Healthy Lifestyle    Special Needs None    Preferred Learning Style No preference indicated    Learning Readiness Ready    How often do you need to have someone help you when you read instructions, pamphlets, or other written materials from your doctor or pharmacy? 1 - Never    What is the last grade level you completed in school? assocaites degree      Pre-Education Assessment   Patient understands the diabetes disease and treatment process. Needs Review    Patient understands incorporating nutritional management into lifestyle. Needs Review    Patient undertands incorporating physical activity into lifestyle. Needs Review    Patient understands using medications safely. Comprehends key points    Patient understands monitoring blood glucose, interpreting and using results Needs Review    Patient understands prevention, detection, and treatment of acute complications. Needs Instruction    Patient understands prevention, detection, and treatment of chronic complications. Compreheands key points    Patient understands how to develop strategies to address psychosocial issues. Needs Review    Patient understands how to develop strategies to promote health/change behavior. Needs Review      Complications   Last HgB A1C per patient/outside source 7.2 %    How often do you check your blood sugar? 0 times/day (not testing)    Have you had a dilated eye exam in the past 12 months? Yes    Have you had a dental exam in the past 12 months? Yes    Are you checking your feet? Yes    How many days per week are you checking your feet? 7      Dietary Intake   Breakfast skips    Snack (morning) ~10 am: eggs or eggs with tortilla with cheese or egg with 2 slice of rye toast, coffee with splenda and  half and half    Lunch skips    Snack (afternoon) ~ 2 pm: occassionally appe or banana with peanut butter    Dinner ~6:30pm: grilled chicken, salad with dressing or broocoli or corn or peas or potatoes or noodles, water    Snack (evening) none    Beverage(s) coffee with splenda and half and half, water, diet gingerale or diet pepsi      Activity / Exercise   Activity / Exercise Type Light (walking / raking leaves)    How many days per week do you exercise? 3    How many minutes per  day do you exercise? 30    Total minutes per week of exercise 90      Patient Education   Previous Diabetes Education No    Disease Pathophysiology Explored patient's options for treatment of their diabetes    Healthy Eating Plate Method;Reviewed blood glucose goals for pre and post meals and how to evaluate the patients' food intake on their blood glucose level.;Role of diet in the treatment of diabetes and the relationship between the three main macronutrients and blood glucose level;Food label reading, portion sizes and measuring food.;Carbohydrate counting    Being Active Role of exercise on diabetes management, blood pressure control and cardiac health.    Medications Reviewed patients medication for diabetes, action, purpose, timing of dose and side effects.    Monitoring Identified appropriate SMBG and/or A1C goals.    Chronic complications Relationship between chronic complications and blood glucose control;Lipid levels, blood glucose control and heart disease    Diabetes Stress and Support Identified and addressed patients feelings and concerns about diabetes    Preconception care --   n/a   Lifestyle and Health Coping Lifestyle issues that need to be addressed for better diabetes care      Individualized Goals (developed by patient)   Nutrition Follow meal plan discussed    Physical Activity Exercise 3-5 times per week;30 minutes per day    Medications take my medication as prescribed    Monitoring   Test my blood glucose as discussed    Problem Solving Eating Pattern;Addressing barriers to behavior change    Reducing Risk do foot checks daily    Health Coping Ask for help with psychological, social, or emotional issues      Post-Education Assessment   Patient understands the diabetes disease and treatment process. Comprehends key points    Patient understands incorporating nutritional management into lifestyle. Comprehends key points    Patient undertands incorporating physical activity into lifestyle. Comprehends key points    Patient understands using medications safely. Demonstrates understanding / competency    Patient understands monitoring blood glucose, interpreting and using results Needs Review    Patient understands prevention, detection, and treatment of acute complications. Needs Review    Patient understands prevention, detection, and treatment of chronic complications. Comprehends key points    Patient understands how to develop strategies to address psychosocial issues. Needs Review    Patient understands how to develop strategies to promote health/change behavior. Needs Review      Outcomes   Expected Outcomes Demonstrated interest in learning. Expect positive outcomes    Future DMSE 3-4 months    Program Status Not Completed          Individualized Plan for Diabetes Self-Management Training:   Learning Objective:  Patient will have a greater understanding of diabetes self-management. Patient education plan is to attend individual and/or group sessions per assessed needs and concerns.   Plan:   Patient Instructions  Recipe Analyzer: [email or web address]   Aim for a 9 inch plate half with non starchy vegetables, 1/4 protein, 1/4 starchy vegetables            Expected Outcomes:  Demonstrated interest in learning. Expect positive outcomes   Education material provided: ADA - How to Thrive: A Guide for Your Journey with Diabetes, Food  label handouts, My Plate, Snack sheet, and Diabetes Resources  If problems or questions, patient to contact team via:  Phone  Future DSME appointment: 3-4 months

## 2024-06-19 ENCOUNTER — Encounter: Attending: Student | Admitting: Dietician

## 2024-06-19 DIAGNOSIS — E119 Type 2 diabetes mellitus without complications: Secondary | ICD-10-CM | POA: Insufficient documentation

## 2024-06-19 DIAGNOSIS — Z713 Dietary counseling and surveillance: Secondary | ICD-10-CM | POA: Diagnosis not present

## 2024-06-19 NOTE — Patient Instructions (Addendum)
 Recipe Analyzer: [email or web address]   Aim for a 9 inch plate half with non starchy vegetables, 1/4 protein, 1/4 starchy vegetables

## 2024-06-27 ENCOUNTER — Other Ambulatory Visit: Payer: Self-pay | Admitting: Student

## 2024-06-27 DIAGNOSIS — I152 Hypertension secondary to endocrine disorders: Secondary | ICD-10-CM

## 2024-06-27 NOTE — Telephone Encounter (Signed)
 Copied from CRM [id number]. Topic: Clinical - Medication Refill >> Jun 27, 2024  4:10 PM Winona R wrote: Medication:  irbesartan  (AVAPRO ) 300 MG tablet   Has the patient contacted their pharmacy? No (Agent: If no, request that the patient contact the pharmacy for the refill. If patient does not wish to contact the pharmacy document the reason why and proceed with request.) (Agent: If yes, when and what did the pharmacy advise?)  This is the patient's preferred pharmacy:  [id number], [place] - [address] AT [facility] OF [address] [place] [address] Phone: [phone] Fax: [phone]  Is this the correct pharmacy for this prescription? Yes If no, delete pharmacy and type the correct one.   Has the prescription been filled recently? No  Is the patient out of the medication? Yes  Has the patient been seen for an appointment in the last year OR does the patient have an upcoming appointment? Yes  Can we respond through MyChart? Yes  Agent: Please be advised that Rx refills may take up to 3 business days. We ask that you follow-up with your pharmacy.

## 2024-06-30 MED ORDER — IRBESARTAN 300 MG PO TABS: 300.0000 mg | ORAL_TABLET | Freq: Every day | ORAL | 0 refills | Status: AC

## 2024-06-30 NOTE — Telephone Encounter (Signed)
 Requested Prescriptions  Pending Prescriptions Disp Refills   irbesartan  (AVAPRO ) 300 MG tablet 90 tablet 0    Sig: Take 1 tablet (300 mg total) by mouth daily.     Cardiovascular:  Angiotensin Receptor Blockers Passed - 06/30/2024 12:17 PM      Passed - Cr in normal range and within 180 days    Creatinine, Ser  Date Value Ref Range Status  05/29/2024 0.81 0.57 - 1.00 mg/dL Final         Passed - K in normal range and within 180 days    Potassium  Date Value Ref Range Status  05/29/2024 4.4 3.5 - 5.2 mmol/L Final         Passed - Patient is not pregnant      Passed - Last BP in normal range    BP Readings from Last 1 Encounters:  06/08/24 138/88         Passed - Valid encounter within last 6 months    Recent Outpatient Visits           3 weeks ago Hypertension associated with type 2 diabetes mellitus (HCC)   [place] [facility], Harlene, MD   1 month ago Hypertension associated with type 2 diabetes mellitus (HCC)    [facility] at [facility], Harlene, MD   1 month ago Diabetes mellitus type II, controlled [facility])   [place] [facility], Harlene, MD   4 months ago Hypertension associated with type 2 diabetes mellitus [facility])   [place] [facility] at [facility], Harlene, MD   9 months ago Diabetes mellitus treated with oral medication [facility])   [place] [facility] at [facility] Cathryne JAYSON, MD

## 2024-08-01 ENCOUNTER — Other Ambulatory Visit: Payer: Self-pay | Admitting: Student

## 2024-08-01 ENCOUNTER — Other Ambulatory Visit: Payer: Self-pay

## 2024-08-01 DIAGNOSIS — E119 Type 2 diabetes mellitus without complications: Secondary | ICD-10-CM

## 2024-08-01 MED ORDER — METFORMIN HCL ER 500 MG PO TB24: 500.0000 mg | ORAL_TABLET | Freq: Every day | ORAL | 0 refills | Status: AC

## 2024-08-01 NOTE — Telephone Encounter (Signed)
 Copied from CRM #[id number]. Topic: Clinical - Medication Refill >> Aug 01, 2024  1:28 PM Barbara Cortez wrote: Medication:  metFORMIN  (GLUCOPHAGE -XR) 500 MG 24 hr tablet   Has the patient contacted their pharmacy? Yes (Agent: If no, request that the patient contact the pharmacy for the refill. If patient does not wish to contact the pharmacy document the reason why and proceed with request.) (Agent: If yes, when and what did the pharmacy advise?)  This is the patient's preferred pharmacy:  [facility] #[id number] [facility], [place] - [address] AT [facility] [address] [place] [address] Phone: [phone] Fax: [phone]  Is this the correct pharmacy for this prescription? Yes If no, delete pharmacy and type the correct one.   Has the prescription been filled recently? Yes  Is the patient out of the medication? No  Has the patient been seen for an appointment in the last year OR does the patient have an upcoming appointment? Yes  Can we respond through MyChart? Yes  Agent: Please be advised that Rx refills may take up to 3 business days. We ask that you follow-up with your pharmacy.

## 2024-09-04 ENCOUNTER — Other Ambulatory Visit: Payer: Self-pay | Admitting: Student

## 2024-09-05 NOTE — Telephone Encounter (Signed)
 Please review medication refill request

## 2024-09-15 ENCOUNTER — Ambulatory Visit: Admitting: Student
